# Patient Record
Sex: Female | Born: 1940 | Race: White | Hispanic: No | Marital: Single | Smoking: Former smoker
Health system: Southern US, Community
[De-identification: ages and names within clinical notes are randomized; demographics above are authoritative.]

## PROBLEM LIST (undated history)

## (undated) DIAGNOSIS — J449 Chronic obstructive pulmonary disease, unspecified: Secondary | ICD-10-CM

## (undated) DIAGNOSIS — N200 Calculus of kidney: Secondary | ICD-10-CM

## (undated) DIAGNOSIS — I10 Essential (primary) hypertension: Secondary | ICD-10-CM

## (undated) DIAGNOSIS — J189 Pneumonia, unspecified organism: Secondary | ICD-10-CM

## (undated) DIAGNOSIS — E785 Hyperlipidemia, unspecified: Secondary | ICD-10-CM

---

## 2011-11-01 ENCOUNTER — Inpatient Hospital Stay: Payer: Self-pay | Admitting: Internal Medicine

## 2011-11-01 LAB — CBC
MCHC: 34.8 g/dL (ref 32.0–36.0)
Platelet: 372 10*3/uL (ref 150–440)
RDW: 14.1 % (ref 11.5–14.5)

## 2011-11-01 LAB — BASIC METABOLIC PANEL
Anion Gap: 12 (ref 7–16)
BUN: 20 mg/dL — ABNORMAL HIGH (ref 7–18)
Chloride: 91 mmol/L — ABNORMAL LOW (ref 98–107)
EGFR (African American): 36 — ABNORMAL LOW
EGFR (Non-African Amer.): 31 — ABNORMAL LOW
Glucose: 111 mg/dL — ABNORMAL HIGH (ref 65–99)
Osmolality: 255 (ref 275–301)
Potassium: 3.8 mmol/L (ref 3.5–5.1)
Sodium: 125 mmol/L — ABNORMAL LOW (ref 136–145)

## 2011-11-01 LAB — TROPONIN I: Troponin-I: <0.02 ng/mL

## 2011-11-02 LAB — CBC WITH DIFFERENTIAL/PLATELET
Basophil %: 0 %
HCT: 32.4 % — ABNORMAL LOW (ref 35.0–47.0)
HGB: 11 g/dL — ABNORMAL LOW (ref 12.0–16.0)
Lymphocyte #: 0.6 10*3/uL — ABNORMAL LOW (ref 1.0–3.6)
Lymphocyte %: 4.8 %
MCHC: 34 g/dL (ref 32.0–36.0)
Monocyte %: 4.7 %
Neutrophil #: 12.1 10*3/uL — ABNORMAL HIGH (ref 1.4–6.5)
Neutrophil %: 90.5 %
RDW: 14.2 % (ref 11.5–14.5)
WBC: 13.3 10*3/uL — ABNORMAL HIGH (ref 3.6–11.0)

## 2011-11-02 LAB — BASIC METABOLIC PANEL
Anion Gap: 9 (ref 7–16)
Calcium, Total: 8 mg/dL — ABNORMAL LOW (ref 8.5–10.1)
Co2: 23 mmol/L (ref 21–32)
Creatinine: 1.41 mg/dL — ABNORMAL HIGH (ref 0.60–1.30)
EGFR (African American): 44 — ABNORMAL LOW
Glucose: 123 mg/dL — ABNORMAL HIGH (ref 65–99)

## 2011-11-02 LAB — OSMOLALITY: Osmolality: 270 mOsm/kg — ABNORMAL LOW (ref 280–301)

## 2011-11-03 LAB — CBC WITH DIFFERENTIAL/PLATELET
Basophil %: 0.1 %
Eosinophil %: 0 %
HGB: 10.8 g/dL — ABNORMAL LOW (ref 12.0–16.0)
Lymphocyte %: 3.6 %
Monocyte %: 3.6 %
Neutrophil %: 92.7 %
RBC: 3.87 10*6/uL (ref 3.80–5.20)
WBC: 19.8 10*3/uL — ABNORMAL HIGH (ref 3.6–11.0)

## 2011-11-03 LAB — BASIC METABOLIC PANEL
Chloride: 96 mmol/L — ABNORMAL LOW (ref 98–107)
Co2: 22 mmol/L (ref 21–32)
Creatinine: 1.59 mg/dL — ABNORMAL HIGH (ref 0.60–1.30)
EGFR (African American): 38 — ABNORMAL LOW
Glucose: 108 mg/dL — ABNORMAL HIGH (ref 65–99)
Potassium: 3.8 mmol/L (ref 3.5–5.1)

## 2011-11-04 LAB — BASIC METABOLIC PANEL
Anion Gap: 10 (ref 7–16)
Calcium, Total: 7.6 mg/dL — ABNORMAL LOW (ref 8.5–10.1)
EGFR (Non-African Amer.): 33 — ABNORMAL LOW
Glucose: 116 mg/dL — ABNORMAL HIGH (ref 65–99)
Osmolality: 270 (ref 275–301)
Potassium: 3.8 mmol/L (ref 3.5–5.1)
Sodium: 131 mmol/L — ABNORMAL LOW (ref 136–145)

## 2011-11-05 LAB — BASIC METABOLIC PANEL
Anion Gap: 9 (ref 7–16)
BUN: 37 mg/dL — ABNORMAL HIGH (ref 7–18)
Calcium, Total: 8.2 mg/dL — ABNORMAL LOW (ref 8.5–10.1)
Chloride: 96 mmol/L — ABNORMAL LOW (ref 98–107)
Creatinine: 1.89 mg/dL — ABNORMAL HIGH (ref 0.60–1.30)
EGFR (African American): 31 — ABNORMAL LOW
EGFR (Non-African Amer.): 27 — ABNORMAL LOW
Glucose: 106 mg/dL — ABNORMAL HIGH (ref 65–99)
Osmolality: 277 (ref 275–301)
Potassium: 4.3 mmol/L (ref 3.5–5.1)

## 2011-11-05 LAB — CBC WITH DIFFERENTIAL/PLATELET
Basophil #: 0 10*3/uL (ref 0.0–0.1)
Basophil %: 0.1 %
Eosinophil #: 0 10*3/uL (ref 0.0–0.7)
Eosinophil %: 0 %
HCT: 34.5 % — ABNORMAL LOW (ref 35.0–47.0)
HGB: 11.3 g/dL — ABNORMAL LOW (ref 12.0–16.0)
Lymphocyte %: 4.8 %
MCHC: 32.9 g/dL (ref 32.0–36.0)
Neutrophil #: 15.8 10*3/uL — ABNORMAL HIGH (ref 1.4–6.5)
Neutrophil %: 89.1 %
RBC: 4.18 10*6/uL (ref 3.80–5.20)
RDW: 14.5 % (ref 11.5–14.5)

## 2011-11-06 LAB — CULTURE, BLOOD (SINGLE)

## 2012-11-12 DIAGNOSIS — F32A Depression, unspecified: Secondary | ICD-10-CM | POA: Insufficient documentation

## 2012-11-12 DIAGNOSIS — E785 Hyperlipidemia, unspecified: Secondary | ICD-10-CM | POA: Insufficient documentation

## 2012-11-12 DIAGNOSIS — M810 Age-related osteoporosis without current pathological fracture: Secondary | ICD-10-CM | POA: Insufficient documentation

## 2012-11-12 DIAGNOSIS — F329 Major depressive disorder, single episode, unspecified: Secondary | ICD-10-CM | POA: Insufficient documentation

## 2013-02-21 ENCOUNTER — Emergency Department: Payer: Self-pay | Admitting: Internal Medicine

## 2013-02-21 LAB — COMPREHENSIVE METABOLIC PANEL
ALBUMIN: 3.7 g/dL (ref 3.4–5.0)
Alkaline Phosphatase: 106 U/L
Anion Gap: 5 — ABNORMAL LOW (ref 7–16)
BILIRUBIN TOTAL: 0.4 mg/dL (ref 0.2–1.0)
BUN: 9 mg/dL (ref 7–18)
CHLORIDE: 103 mmol/L (ref 98–107)
Calcium, Total: 8.7 mg/dL (ref 8.5–10.1)
Co2: 27 mmol/L (ref 21–32)
Creatinine: 1.21 mg/dL (ref 0.60–1.30)
EGFR (African American): 52 — ABNORMAL LOW
EGFR (Non-African Amer.): 45 — ABNORMAL LOW
GLUCOSE: 102 mg/dL — AB (ref 65–99)
Osmolality: 269 (ref 275–301)
POTASSIUM: 3.4 mmol/L — AB (ref 3.5–5.1)
SGOT(AST): 27 U/L (ref 15–37)
SGPT (ALT): 17 U/L (ref 12–78)
Sodium: 135 mmol/L — ABNORMAL LOW (ref 136–145)
Total Protein: 7.1 g/dL (ref 6.4–8.2)

## 2013-02-21 LAB — LIPASE, BLOOD: LIPASE: 107 U/L (ref 73–393)

## 2013-02-21 LAB — URINALYSIS, COMPLETE
BACTERIA: NONE SEEN
BILIRUBIN, UR: NEGATIVE
GLUCOSE, UR: NEGATIVE mg/dL (ref 0–75)
NITRITE: NEGATIVE
Ph: 7 (ref 4.5–8.0)
Protein: NEGATIVE
Specific Gravity: 1.013 (ref 1.003–1.030)
Squamous Epithelial: 2
WBC UR: <1 /HPF (ref 0–5)

## 2013-02-21 LAB — CBC
HCT: 35.7 % (ref 35.0–47.0)
HGB: 12.3 g/dL (ref 12.0–16.0)
MCH: 27.9 pg (ref 26.0–34.0)
MCHC: 34.4 g/dL (ref 32.0–36.0)
MCV: 81 fL (ref 80–100)
Platelet: 284 10*3/uL (ref 150–440)
RBC: 4.41 10*6/uL (ref 3.80–5.20)
RDW: 14.3 % (ref 11.5–14.5)
WBC: 10.9 10*3/uL (ref 3.6–11.0)

## 2013-02-21 LAB — TROPONIN I: Troponin-I: <0.02 ng/mL

## 2013-04-18 ENCOUNTER — Emergency Department: Payer: Self-pay | Admitting: Emergency Medicine

## 2013-08-10 DIAGNOSIS — J449 Chronic obstructive pulmonary disease, unspecified: Secondary | ICD-10-CM | POA: Insufficient documentation

## 2014-06-01 NOTE — Discharge Summary (Signed)
PATIENT NAME:  Penny Johnson, Penny Johnson DATE OF BIRTH:  05/01/1940  DATE OF ADMISSION:  11/01/2011 DATE OF DISCHARGE:  11/07/2011  DISCHARGE DIAGNOSES:  1. Acute on chronic respiratory failure, chronic obstructive pulmonary disease exacerbation and sepsis.  2. Hyponatremia, syndrome of inappropriate antidiuretic hormone.  3. Deconditioning. 4. Depression.  5. Hypertension.  6. Tachycardia.  7. Vitamin D deficiency.  8. Hyperlipidemia.  9. Acute on chronic renal failure.   DISCHARGE MEDICATIONS:  1. Alendronate 70 mg daily. 2. Vitamin D 50,000 units once monthly. 3. Meclizine 25 mg p.o. t.i.d.  4. Celexa 20 mg 2 tablets daily.  5. Simvastatin 40 mg p.o. daily.  6. Bystolic 5 mg p.o. daily.  7. Spiriva 18 micrograms inhalation daily.  8. Advair Diskus 250/50, one puff b.i.d.  9. Ventolin daily.  10. Mucinex 600 mg p.o. b.i.d.  11. Lasix 40 mg p.o. every other day.  12. Combivent 1 puff 4 times daily.  13. Augmentin 1 tablet p.o. b.i.d. for  10 days.  14. Prednisone dose tapering 60 mg daily for three days, 50 mg daily for three days, 40 mg daily for three days, 30 mg daily for three days, 20 mg daily for three days.   DIET: Low sodium diet.   CONSULTATIONS: Pulmonary consult with Dr. Belia HemanKasa.   HOSPITAL COURSE:  1. The patient is a 74 year old female with history of chronic obstructive pulmonary disease who came in because of shortness of breath, cough and weakness. The patient was given Levaquin and prednisone as an outpatient and treated for pneumonia because of significant trouble breathing. She came in because of her pneumonia and chronic obstructive pulmonary disease exacerbation. Continued on IV Rocephin and Zithromax along with nebulizers and steroids. The patient was observed in the Intensive Care Unit. She did not want to be intubated on CPAP and the patient continued to improve in terms of her respiration. The patient's oxygen saturation 98% on 4 liters. Her blood  pressure was 142/55, white count was 13.6 on admission. BUN 20, creatinine 1.68. Chest x-ray showed right-sided infiltrate. The patient  improved with treatment. The patient was ambulating to the bathroom with two liters of oxygen and felt much better, so we discharged her with prednisone and Augmentin. The blood cultures have been negative.and she had echo done which showed normal ejection fraction and normal systolic function, ejection fraction of 52%. The patient was seen by Dr. Belia HemanKasa as well and we made an appointment with Dr. Meredeth IdeFleming as an outpatient because of her chronic obstructive pulmonary disease.  2. Chronic renal insufficiency, hyponatremia. Creatinine is around 1.5 and sodium was 127 and 128, so we had to stop the fluids and give her Lasix. Lasix helped her with her sodium coming up to 134. She has some kidney problem. She told me that she had a history of stones before and ultrasound showed small kidneys. We made an appointment with Dr. Heide SparkMunson Lateef to evaluate for chronic kidney disease. She was given Lasix but advised her to take only on alternate days and follow up with Dr. Mady HaagensenMunsoor Lateef or Dr. Thedore MinsSingh.  3. Hypertension. Blood pressure was on the low side, so will stopped her Micardis that she was taking and she is only on Lasix.  4. History of depression. Continued on Celexa.  5. She has vitamin D deficiency. Advised to continue vitamin D.  6. The patient's oxygen saturation dropped to 78 with exertion, on 2 liters went up to 92, so advised to use oxygen all the time.  TIME SPENT ON DISCHARGE PREPARATION: More than 30 minutes. ____________________________ Katha Hamming, MD sk:ap D: 11/08/2011 14:02:50 ET T: 11/09/2011 11:54:11 ET JOB#: [id number] cc: Katha Hamming, MD, <Dictator> [name], MD Katha Hamming MD ELECTRONICALLY SIGNED 11/18/2011 14:52

## 2014-06-01 NOTE — H&P (Signed)
PATIENT NAME:  Penny Johnson, Penny Johnson MR#:  [id number] DATE OF BIRTH:  [birth year]  DATE OF ADMISSION:  11/01/2011  PRIMARY CARE PHYSICIAN: Dr. Mikey Bussing at [facility] in [place]   CHIEF COMPLAINT: Shortness of breath and cough.   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 Patient was in her usual state of health until about two weeks ago when she developed generalized weakness. She presented to her primary care provider and at that time chest x-ray was done which showed a right-sided pneumonia. She was started on prednisone 40 mg daily as well as Levaquin 500 mg for seven days. She was reassessed by her primary care physician three days ago and was asked to continue her antibiotic regimen for an additional week and she was to start a prednisone taper at that time. However, on the day of presentation she got progressive severe dyspnea throughout the day with associated weakness, fatigue, decreased p.o. intake so she decided to present to the Emergency Department. She notes that she has had a productive cough productive of clear sputum of increased frequency over the last day or so. She denies fevers.   Regarding her chronic obstructive pulmonary disease history, she notes that she does not follow with a pulmonologist. She has been managed by her primary care physician and has been stable. Daughters are in the room and contribute to the history stating that she has pursed lip breathing chronically and breathes fairly rapidly at baseline. Stating that she cannot walk to the bathroom due to shortness of breath. She have home oxygen, however, she says she does not use it daily and does not use it continuously, however, since she was diagnosed with pneumonia a week ago she has required continuous oxygen use and has increased her  albuterol inhaler use to five times a day where she usually uses it four times daily.    Upon evaluation in the Emergency Department she was noted to be more hypoxic, had increased oxygen needs, usually uses 2 liters of oxygen at home, required 4 liters in the Emergency Department, had leukocytosis, and right-sided infiltrate, as well as signs of acute renal failure so we have been asked to admit the patient.     PAST MEDICAL HISTORY:  1. Chronic obstructive pulmonary disease, emphysema, has home oxygen at home but does not use it daily.  2. Recent diagnosis of pneumonia one week ago undergoing treatment in the outpatient setting currently.  3. Hyperlipidemia.  4. Hypertension.  5. Depression.  6. Osteoporosis.  7. History of nephrolithiasis.  8. Vitamin D deficiency.  9. History of coal dust exposure due to working at a [employer]   PAST SURGICAL HISTORY: Bilateral cataract surgery two months ago.   ALLERGIES: No known drug allergies but has seasonal allergies.   MEDICATIONS:  1. Advair Diskus 250/50 mcg 1 puff inhaled twice a day.  2. Albuterol inhaled 4 times a day.  3. Alendronate 40 mg once a month.  4. Bystolic 5 mg 1 tablet once a day as needed.  5. Citalopram 20 mg daily.  6. Meclizine 25 mg 3 times a day as needed.  7. Micardis 40 mg daily.  8. Simvastatin 40 mg daily at bedtime.  9. Spiriva 18 mcg inhaled daily.  10. Vitamin D2 50,000 international units (1.25 mg) monthly.  11. Zyrtec as needed.   FAMILY HISTORY: Father had  Alzheimer's. Mother had skin cancer and hypertension. Brother had myocardial infarction. No history of cerebrovascular.   SOCIAL HISTORY: Lives with her daughter. Had remote tobacco use, 1 pack per day from age 74 to [age], quit 10 years ago. Denies alcohol or illicit drug use. 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 She has two daughters and three grandchildren.   REVIEW OF SYSTEMS: CONSTITUTIONAL:  Denies fever, weight loss. Admits to fatigue and generalized weakness. She admits to decreased p.o. intake. NEURO: Denies numbness or cerebrovascular accident. Admits to mild dizziness that is intermittent in nature, nothing recently. PSYCHIATRIC: Admits to recent insomnia since she was started on prednisone. Admits to depression. EYES: Denies blurred vision or double vision. Denies eye pain. Had cataract surgery. ENT: Has chronic tinnitus and has left ear hearing difficulty. Has seasonal allergies and postnasal drip. RESPIRATION: Admits to cough, wheezing, dyspnea. Denies painful respirations. CARDIOVASCULAR: Admits to heaviness of her chest this evening, intermittent palpitations overall and dyspnea on exertion. Denies orthopnea or edema. Admits to intermittent palpitations. GASTROINTESTINAL: Denies nausea, vomiting, diarrhea, abdominal pain. GENITOURINARY: Denies dysuria, frequency. HEME: Admits to easy bruising. INTEGUMENT: No new skin rashes. MUSCULOSKELETAL: Has chronic bilateral shoulder pain but no joint swelling.    PHYSICAL EXAMINATION:  VITAL SIGNS: Temperature 97.7, heart rate ranged between 76 to 100, respirations ranged between 24 to 39, blood pressure 114/55, sating 98% on 4 liters nasal cannula.   GENERAL: Elderly female, well appearing, however, in moderate respiratory distress. She is able to speak in bursts of four to five word sentences. She does have pursed lip breathing.   EYES: Pupils equal, round, reactive to light and accommodation. Anicteric sclerae. Normal lids.   ENT: Normal external ears and nares. Posterior oropharynx is clear without erythema or exudate.   RESPIRATORY: Diffuse wheezing with prolonged expiratory phase. She does have increased effort and moderate respiratory distress. During my exam she was breathing at about 40 times a minute.   CARDIOVASCULAR: Tachycardic. No murmurs appreciated. There is no lower extremity edema.   ABDOMEN: Soft, nontender, nondistended  without hepatosplenomegaly.   MUSCULOSKELETAL: She has full strength bilateral upper and lower extremities. There is no digital clubbing. There is normal tone. There is no cyanosis.   SKIN: Warm and dry. There are no lesions. Patient does not appear cyanotic.    LYMPHATIC: No cervical or inguinal lymphadenopathy appreciated.   PSYCHIATRIC: Patient is awake, alert, oriented to time, person, place and situation. Judgment is intact   LABORATORY, DIAGNOSTIC AND RADIOLOGICAL DATA:  Troponin is less than 0.02. CK 186, CK-MB 5.9.   WBC count is elevated at 13.6, hemoglobin 10.5, hematocrit 30.1, platelet count 372, MCV 81.   Glucose 111, BUN 20, creatinine 1.68, sodium 125, potassium 3.8, chloride 91, bicarbonate 22, calcium 8.6, anion gap 12, osmolality 255, GFR 36.   Chest x-ray on my read shows hyperinflation with right-sided infiltrate. There is no effusion. Official report is pending.   EKG shows normal sinus rhythm at 26 beats per minute. There is low voltage diffusely. No acute ST-T wave abnormalities.    ASSESSMENT AND PLAN: 74 year old female presenting with tachypnea, leukocytosis, right-sided infiltrate concerning for pneumonia with sepsis as well as acute respiratory failure as a result of superimposed chronic obstructive pulmonary disease exacerbation, acute renal failure, hyponatremia.  1. Pneumonia with severe sepsis as evidenced by tachypnea, leukocytosis, renal and acute on chronic respiratory failure. First will expand IV antibiotics, change to vancomycin, cefepime  and Zithromax, even though patient denied history of nosocomial exposure. Will check blood as well as respiratory cultures and adjust antibiotics within 48 hours or so within approximately 48 hours. Continue Solu-Medrol as well as nebulizer treatments. 2. Acute on chronic respiratory failure due to pneumonia as well as acute chronic obstructive pulmonary disease exacerbation which is most likely as a result of her  pneumonia. Family's description of patient's baseline sounds like she might have severe chronic obstructive pulmonary disease, however, we do not have pulmonary function tests at this facility to indicate that currently. Will continue Solu-Medrol, nebulizers and antibiotic therapy as well as supplemental oxygen as indicated. Will place patient on BiPAP. Check an ABG and lactate now to rule out superimposed hypercarbia given her respiratory rate. I am concerned that this patient will tire out due to her tachypnea and this was discussed with the patient. She is amenable to short-term intubation for a reversible process such as this one, however, she did not want to be resuscitated. Will check V/Q scan to rule out chronic pulmonary embolism. 3. Acute renal failure. This is most likely combination of medication use superimposed on decreased p.o. intake with her acute illness. We will hydrate her and follow IV fluids. Will also request records from her primary care provider. Will monitor BMPs.  4. Hyponatremia, hypokalemia. This is most likely as a result of decreased p.o. intake. However, given her pneumonia and respiratory status there is possibility of underlying SIADH as well. We do not have a baseline. At this time will check urine studies and hydrate her gently.  5. Hypertension, currently stable. Will continue her home medications. Will discontinue her ARB. 6. Hyperlipidemia, stable. Continue her home medications.  7. Osteoporosis. Continue vitamin D. Hold Fosamax.  8. Prophylaxis. Heparin.  9. Disposition: Patient is being admitted to the Critical Care Unit for severe pneumonia, severe sepsis, acute on chronic respiratory failure, acute renal failure.  10. Patient's condition is serious and prognosis is guarded. This was explained to the patient and her family. 11. CODE STATUS: Patient is a DO NOT RESUSCITATE and both her daughters are powers of attorney for her, however, the primary contact and decision  maker is Ferne Reus, phone number [phone]. Family is aware of patient's wishes.  12. We shall to obtain records from Dr. Neita Garnet office at [facility].   CRITICAL CARE TIME SPENT: 70 minutes.  ____________________________ Aurther Loft, DO aeo:cms D: 11/01/2011 06:29:10 ET T: 11/01/2011 07:04:18 ET JOB#: [id number]  cc: Aurther Loft, DO, <Dictator> Dr. Mikey Bussing at [facility] in [place] Hamsini Verrilli E Jessy Cybulski DO ELECTRONICALLY SIGNED 11/18/2011 0:47

## 2018-06-05 ENCOUNTER — Inpatient Hospital Stay
Admission: EM | Admit: 2018-06-05 | Discharge: 2018-06-08 | DRG: 418 | Disposition: A | Discharge status: Home / Self Care | Payer: Medicare HMO | Attending: Internal Medicine | Admitting: Internal Medicine

## 2018-06-05 ENCOUNTER — Emergency Department: Payer: Medicare HMO

## 2018-06-05 ENCOUNTER — Other Ambulatory Visit: Payer: Self-pay

## 2018-06-05 ENCOUNTER — Encounter
Admission: EM | Disposition: A | Discharge status: Home / Self Care | Payer: Self-pay | Source: Home / Self Care | Attending: Internal Medicine

## 2018-06-05 ENCOUNTER — Inpatient Hospital Stay: Payer: Medicare HMO | Admitting: Anesthesiology

## 2018-06-05 ENCOUNTER — Encounter: Payer: Self-pay | Admitting: Emergency Medicine

## 2018-06-05 ENCOUNTER — Other Ambulatory Visit (HOSPITAL_COMMUNITY): Payer: Self-pay

## 2018-06-05 ENCOUNTER — Inpatient Hospital Stay: Payer: Medicare HMO

## 2018-06-05 DIAGNOSIS — K8050 Calculus of bile duct without cholangitis or cholecystitis without obstruction: Secondary | ICD-10-CM | POA: Diagnosis present

## 2018-06-05 DIAGNOSIS — M810 Age-related osteoporosis without current pathological fracture: Secondary | ICD-10-CM | POA: Diagnosis present

## 2018-06-05 DIAGNOSIS — K8000 Calculus of gallbladder with acute cholecystitis without obstruction: Secondary | ICD-10-CM | POA: Diagnosis present

## 2018-06-05 DIAGNOSIS — Z79899 Other long term (current) drug therapy: Secondary | ICD-10-CM

## 2018-06-05 DIAGNOSIS — R7989 Other specified abnormal findings of blood chemistry: Secondary | ICD-10-CM | POA: Diagnosis present

## 2018-06-05 DIAGNOSIS — Z808 Family history of malignant neoplasm of other organs or systems: Secondary | ICD-10-CM

## 2018-06-05 DIAGNOSIS — R932 Abnormal findings on diagnostic imaging of liver and biliary tract: Secondary | ICD-10-CM

## 2018-06-05 DIAGNOSIS — I10 Essential (primary) hypertension: Secondary | ICD-10-CM | POA: Diagnosis present

## 2018-06-05 DIAGNOSIS — Z7951 Long term (current) use of inhaled steroids: Secondary | ICD-10-CM | POA: Diagnosis not present

## 2018-06-05 DIAGNOSIS — E876 Hypokalemia: Secondary | ICD-10-CM | POA: Diagnosis present

## 2018-06-05 DIAGNOSIS — Z820 Family history of epilepsy and other diseases of the nervous system: Secondary | ICD-10-CM

## 2018-06-05 DIAGNOSIS — K660 Peritoneal adhesions (postprocedural) (postinfection): Secondary | ICD-10-CM | POA: Diagnosis present

## 2018-06-05 DIAGNOSIS — N179 Acute kidney failure, unspecified: Secondary | ICD-10-CM | POA: Diagnosis present

## 2018-06-05 DIAGNOSIS — K5730 Diverticulosis of large intestine without perforation or abscess without bleeding: Secondary | ICD-10-CM | POA: Diagnosis present

## 2018-06-05 DIAGNOSIS — Z8249 Family history of ischemic heart disease and other diseases of the circulatory system: Secondary | ICD-10-CM

## 2018-06-05 DIAGNOSIS — K8071 Calculus of gallbladder and bile duct without cholecystitis with obstruction: Secondary | ICD-10-CM | POA: Diagnosis present

## 2018-06-05 DIAGNOSIS — Z87442 Personal history of urinary calculi: Secondary | ICD-10-CM | POA: Diagnosis not present

## 2018-06-05 DIAGNOSIS — Z9981 Dependence on supplemental oxygen: Secondary | ICD-10-CM

## 2018-06-05 DIAGNOSIS — Z419 Encounter for procedure for purposes other than remedying health state, unspecified: Secondary | ICD-10-CM

## 2018-06-05 DIAGNOSIS — E785 Hyperlipidemia, unspecified: Secondary | ICD-10-CM | POA: Diagnosis present

## 2018-06-05 DIAGNOSIS — J441 Chronic obstructive pulmonary disease with (acute) exacerbation: Secondary | ICD-10-CM | POA: Diagnosis present

## 2018-06-05 HISTORY — PX: ERCP: SHX5425

## 2018-06-05 HISTORY — DX: Essential (primary) hypertension: I10

## 2018-06-05 HISTORY — DX: Calculus of kidney: N20.0

## 2018-06-05 HISTORY — DX: Pneumonia, unspecified organism: J18.9

## 2018-06-05 HISTORY — DX: Hyperlipidemia, unspecified: E78.5

## 2018-06-05 HISTORY — DX: Chronic obstructive pulmonary disease, unspecified: J44.9

## 2018-06-05 LAB — COMPREHENSIVE METABOLIC PANEL
ALT: 131 U/L — ABNORMAL HIGH (ref 0–44)
AST: 431 U/L — ABNORMAL HIGH (ref 15–41)
Albumin: 4.2 g/dL (ref 3.5–5.0)
Alkaline Phosphatase: 170 U/L — ABNORMAL HIGH (ref 38–126)
Anion gap: 11 (ref 5–15)
BUN: 12 mg/dL (ref 8–23)
CO2: 27 mmol/L (ref 22–32)
Calcium: 9 mg/dL (ref 8.9–10.3)
Chloride: 96 mmol/L — ABNORMAL LOW (ref 98–111)
Creatinine, Ser: 1.12 mg/dL — ABNORMAL HIGH (ref 0.44–1.00)
GFR calc Af Amer: 55 mL/min — ABNORMAL LOW (ref >60)
GFR calc non Af Amer: 48 mL/min — ABNORMAL LOW (ref >60)
Glucose, Bld: 165 mg/dL — ABNORMAL HIGH (ref 70–99)
Potassium: 3.2 mmol/L — ABNORMAL LOW (ref 3.5–5.1)
Sodium: 134 mmol/L — ABNORMAL LOW (ref 135–145)
Total Bilirubin: 1.9 mg/dL — ABNORMAL HIGH (ref 0.3–1.2)
Total Protein: 7.1 g/dL (ref 6.5–8.1)

## 2018-06-05 LAB — URINALYSIS, COMPLETE (UACMP) WITH MICROSCOPIC
Bacteria, UA: NONE SEEN
Bilirubin Urine: NEGATIVE
Glucose, UA: NEGATIVE mg/dL
Ketones, ur: NEGATIVE mg/dL
Nitrite: NEGATIVE
Protein, ur: NEGATIVE mg/dL
Specific Gravity, Urine: >1.046 — ABNORMAL HIGH (ref 1.005–1.030)
pH: 6 (ref 5.0–8.0)

## 2018-06-05 LAB — CBC WITH DIFFERENTIAL/PLATELET
Abs Immature Granulocytes: 0.05 10*3/uL (ref 0.00–0.07)
Basophils Absolute: 0 10*3/uL (ref 0.0–0.1)
Basophils Relative: 0 %
Eosinophils Absolute: 0 10*3/uL (ref 0.0–0.5)
Eosinophils Relative: 0 %
HCT: 36.4 % (ref 36.0–46.0)
Hemoglobin: 12 g/dL (ref 12.0–15.0)
Immature Granulocytes: 0 %
Lymphocytes Relative: 5 %
Lymphs Abs: 0.6 10*3/uL — ABNORMAL LOW (ref 0.7–4.0)
MCH: 29.5 pg (ref 26.0–34.0)
MCHC: 33 g/dL (ref 30.0–36.0)
MCV: 89.4 fL (ref 80.0–100.0)
Monocytes Absolute: 0.7 10*3/uL (ref 0.1–1.0)
Monocytes Relative: 6 %
Neutro Abs: 10.7 10*3/uL — ABNORMAL HIGH (ref 1.7–7.7)
Neutrophils Relative %: 89 %
Platelets: 243 10*3/uL (ref 150–400)
RBC: 4.07 MIL/uL (ref 3.87–5.11)
RDW: 13.2 % (ref 11.5–15.5)
WBC: 12.2 10*3/uL — ABNORMAL HIGH (ref 4.0–10.5)
nRBC: 0 % (ref 0.0–0.2)

## 2018-06-05 LAB — LIPASE, BLOOD: Lipase: 36 U/L (ref 11–51)

## 2018-06-05 SURGERY — ERCP, WITH INTERVENTION IF INDICATED
Anesthesia: General

## 2018-06-05 MED ORDER — IPRATROPIUM-ALBUTEROL 0.5-2.5 (3) MG/3ML IN SOLN: 3.0000 mL | RESPIRATORY_TRACT | Status: DC | PRN

## 2018-06-05 MED ORDER — FENTANYL CITRATE (PF) 100 MCG/2ML IJ SOLN
INTRAMUSCULAR | Status: DC | PRN
  Administered 2018-06-05: 50 ug via INTRAVENOUS

## 2018-06-05 MED ORDER — IPRATROPIUM-ALBUTEROL 0.5-2.5 (3) MG/3ML IN SOLN
3.0000 mL | Freq: Four times a day (QID) | RESPIRATORY_TRACT | Status: DC
  Administered 2018-06-05 – 2018-06-08 (×10): 3 mL via RESPIRATORY_TRACT
  Filled 2018-06-05 (×10): qty 3

## 2018-06-05 MED ORDER — IPRATROPIUM-ALBUTEROL 0.5-2.5 (3) MG/3ML IN SOLN
3.0000 mL | Freq: Once | RESPIRATORY_TRACT | Status: AC
  Administered 2018-06-05: 3 mL via RESPIRATORY_TRACT

## 2018-06-05 MED ORDER — ALBUTEROL SULFATE HFA 108 (90 BASE) MCG/ACT IN AERS
INHALATION_SPRAY | RESPIRATORY_TRACT | Status: DC | PRN
  Administered 2018-06-05: 6 via RESPIRATORY_TRACT

## 2018-06-05 MED ORDER — DEXAMETHASONE SODIUM PHOSPHATE 4 MG/ML IJ SOLN
INTRAMUSCULAR | Status: AC
  Filled 2018-06-05: qty 1

## 2018-06-05 MED ORDER — FENTANYL CITRATE (PF) 100 MCG/2ML IJ SOLN
INTRAMUSCULAR | Status: AC
  Filled 2018-06-05: qty 2

## 2018-06-05 MED ORDER — SERTRALINE HCL 50 MG PO TABS
25.0000 mg | ORAL_TABLET | Freq: Every day | ORAL | Status: DC
  Administered 2018-06-05 – 2018-06-08 (×3): 25 mg via ORAL
  Filled 2018-06-05 (×3): qty 1

## 2018-06-05 MED ORDER — INDOMETHACIN 50 MG RE SUPP
100.0000 mg | Freq: Once | RECTAL | Status: AC
  Administered 2018-06-05: 100 mg via RECTAL
  Filled 2018-06-05: qty 2

## 2018-06-05 MED ORDER — MOMETASONE FURO-FORMOTEROL FUM 200-5 MCG/ACT IN AERO
2.0000 | INHALATION_SPRAY | Freq: Two times a day (BID) | RESPIRATORY_TRACT | Status: DC
  Administered 2018-06-06 – 2018-06-08 (×6): 2 via RESPIRATORY_TRACT
  Filled 2018-06-05: qty 8.8

## 2018-06-05 MED ORDER — PROPOFOL 10 MG/ML IV BOLUS
INTRAVENOUS | Status: AC
  Filled 2018-06-05: qty 20

## 2018-06-05 MED ORDER — ONDANSETRON HCL 4 MG/2ML IJ SOLN
4.0000 mg | Freq: Once | INTRAMUSCULAR | Status: AC
  Administered 2018-06-05: 05:00:00 4 mg via INTRAVENOUS

## 2018-06-05 MED ORDER — POTASSIUM CHLORIDE 10 MEQ/100ML IV SOLN
10.0000 meq | INTRAVENOUS | Status: AC
  Administered 2018-06-05 (×2): 10 meq via INTRAVENOUS
  Filled 2018-06-05 (×3): qty 100

## 2018-06-05 MED ORDER — PROPOFOL 10 MG/ML IV BOLUS
INTRAVENOUS | Status: DC | PRN
  Administered 2018-06-05: 80 mg via INTRAVENOUS

## 2018-06-05 MED ORDER — ALBUTEROL SULFATE HFA 108 (90 BASE) MCG/ACT IN AERS
INHALATION_SPRAY | RESPIRATORY_TRACT | Status: AC
  Filled 2018-06-05: qty 6.7

## 2018-06-05 MED ORDER — IPRATROPIUM-ALBUTEROL 0.5-2.5 (3) MG/3ML IN SOLN
3.0000 mL | Freq: Four times a day (QID) | RESPIRATORY_TRACT | Status: DC
  Administered 2018-06-05: 14:00:00 3 mL via RESPIRATORY_TRACT
  Filled 2018-06-05: qty 3

## 2018-06-05 MED ORDER — LACTATED RINGERS IV SOLN
INTRAVENOUS | Status: DC
  Administered 2018-06-05: 16:00:00 via INTRAVENOUS

## 2018-06-05 MED ORDER — PIPERACILLIN-TAZOBACTAM 3.375 G IVPB
3.3750 g | Freq: Three times a day (TID) | INTRAVENOUS | Status: DC
  Administered 2018-06-05 – 2018-06-06 (×4): 3.375 g via INTRAVENOUS
  Filled 2018-06-05 (×4): qty 50

## 2018-06-05 MED ORDER — MORPHINE SULFATE (PF) 2 MG/ML IV SOLN
INTRAVENOUS | Status: AC
  Administered 2018-06-05: 2 mg via INTRAVENOUS
  Filled 2018-06-05: qty 1

## 2018-06-05 MED ORDER — DEXAMETHASONE SODIUM PHOSPHATE 10 MG/ML IJ SOLN
INTRAMUSCULAR | Status: DC | PRN
  Administered 2018-06-05: 4 mg via INTRAVENOUS

## 2018-06-05 MED ORDER — INDOMETHACIN 50 MG RE SUPP
RECTAL | Status: AC
  Administered 2018-06-05: 100 mg via RECTAL
  Filled 2018-06-05: qty 1

## 2018-06-05 MED ORDER — ONDANSETRON HCL 4 MG/2ML IJ SOLN
INTRAMUSCULAR | Status: DC | PRN
  Administered 2018-06-05: 4 mg via INTRAVENOUS

## 2018-06-05 MED ORDER — METHYLPREDNISOLONE SODIUM SUCC 40 MG IJ SOLR
40.0000 mg | Freq: Two times a day (BID) | INTRAMUSCULAR | Status: DC
  Administered 2018-06-05 – 2018-06-08 (×6): 40 mg via INTRAVENOUS
  Filled 2018-06-05 (×6): qty 1

## 2018-06-05 MED ORDER — SUGAMMADEX SODIUM 200 MG/2ML IV SOLN
INTRAVENOUS | Status: AC
  Filled 2018-06-05: qty 2

## 2018-06-05 MED ORDER — SODIUM CHLORIDE 0.9 % IV SOLN
INTRAVENOUS | Status: DC
  Administered 2018-06-05: 10:00:00 via INTRAVENOUS

## 2018-06-05 MED ORDER — ONDANSETRON HCL 4 MG/2ML IJ SOLN
INTRAMUSCULAR | Status: AC
  Administered 2018-06-05: 4 mg via INTRAVENOUS
  Filled 2018-06-05: qty 2

## 2018-06-05 MED ORDER — ROCURONIUM BROMIDE 50 MG/5ML IV SOLN
INTRAVENOUS | Status: AC
  Filled 2018-06-05: qty 1

## 2018-06-05 MED ORDER — LIDOCAINE HCL (CARDIAC) PF 100 MG/5ML IV SOSY
PREFILLED_SYRINGE | INTRAVENOUS | Status: DC | PRN
  Administered 2018-06-05: 60 mg via INTRAVENOUS

## 2018-06-05 MED ORDER — IPRATROPIUM-ALBUTEROL 0.5-2.5 (3) MG/3ML IN SOLN
RESPIRATORY_TRACT | Status: AC
  Filled 2018-06-05: qty 6

## 2018-06-05 MED ORDER — SUCCINYLCHOLINE CHLORIDE 20 MG/ML IJ SOLN
INTRAMUSCULAR | Status: AC
  Filled 2018-06-05: qty 1

## 2018-06-05 MED ORDER — SUGAMMADEX SODIUM 200 MG/2ML IV SOLN
INTRAVENOUS | Status: DC | PRN
  Administered 2018-06-05: 140 mg via INTRAVENOUS

## 2018-06-05 MED ORDER — SODIUM CHLORIDE 0.9 % IV BOLUS
500.0000 mL | Freq: Once | INTRAVENOUS | Status: AC
  Administered 2018-06-05: 500 mL via INTRAVENOUS

## 2018-06-05 MED ORDER — AZELASTINE HCL 0.1 % NA SOLN
1.0000 | Freq: Two times a day (BID) | NASAL | Status: DC | PRN
  Filled 2018-06-05: qty 30

## 2018-06-05 MED ORDER — LORATADINE 10 MG PO TABS: 10.0000 mg | ORAL_TABLET | Freq: Every day | ORAL | Status: DC | PRN

## 2018-06-05 MED ORDER — MECLIZINE HCL 25 MG PO TABS
25.0000 mg | ORAL_TABLET | Freq: Three times a day (TID) | ORAL | Status: DC
  Administered 2018-06-05 – 2018-06-06 (×3): 25 mg via ORAL
  Filled 2018-06-05 (×8): qty 1

## 2018-06-05 MED ORDER — LIDOCAINE HCL (PF) 2 % IJ SOLN
INTRAMUSCULAR | Status: AC
  Filled 2018-06-05: qty 10

## 2018-06-05 MED ORDER — CITALOPRAM HYDROBROMIDE 20 MG PO TABS
20.0000 mg | ORAL_TABLET | Freq: Two times a day (BID) | ORAL | Status: DC
  Administered 2018-06-05 – 2018-06-08 (×6): 20 mg via ORAL
  Filled 2018-06-05 (×6): qty 1

## 2018-06-05 MED ORDER — MORPHINE SULFATE (PF) 2 MG/ML IV SOLN
2.0000 mg | Freq: Once | INTRAVENOUS | Status: AC
  Administered 2018-06-05: 05:00:00 2 mg via INTRAVENOUS

## 2018-06-05 MED ORDER — ROCURONIUM BROMIDE 100 MG/10ML IV SOLN
INTRAVENOUS | Status: DC | PRN
  Administered 2018-06-05: 5 mg via INTRAVENOUS

## 2018-06-05 MED ORDER — SODIUM CHLORIDE 0.9 % IV SOLN: INTRAVENOUS | Status: DC

## 2018-06-05 MED ORDER — ONDANSETRON HCL 4 MG/2ML IJ SOLN
INTRAMUSCULAR | Status: AC
  Filled 2018-06-05: qty 2

## 2018-06-05 MED ORDER — IOHEXOL 300 MG/ML  SOLN
100.0000 mL | Freq: Once | INTRAMUSCULAR | Status: AC | PRN
  Administered 2018-06-05: 100 mL via INTRAVENOUS

## 2018-06-05 MED ORDER — SUCCINYLCHOLINE CHLORIDE 20 MG/ML IJ SOLN
INTRAMUSCULAR | Status: DC | PRN
  Administered 2018-06-05: 100 mg via INTRAVENOUS

## 2018-06-05 NOTE — ED Triage Notes (Signed)
Patient coming from home via [facility] for abd pain that started right around midnight. Patient has had some nausea\. Denies fever. Patient has COPD and is chronically on 2L [place] at home.

## 2018-06-05 NOTE — Consult Note (Signed)
Subjective:   CC: choledocolithiasis  HPI:  Penny Johnson is a 78 y.o. female who is consulted by [facility] for evaluation of above cc.  Symptoms were first noted a few days ago. Pain is intermittent, epigastric  Associated with occasional nausa, exacerbated by nothing specific.    Currently denies any pain after getting IV meds.  Denies any SOB, but states she does have some exertional dyspnea at home after moderate activity.     Past Medical History:  has a past medical history of COPD (chronic obstructive pulmonary disease) (HCC), Hyperlipidemia, Hypertension, Nephrolithiasis, and Pneumonia.  Past Surgical History: c-sections  Family History: reviewed and not relevant to CC  Social History:  has no history on file for tobacco, alcohol, and drug.  Current Medications:  Medications Prior to Admission  Medication Sig Dispense Refill  . acetaminophen (TYLENOL) 325 MG tablet Take 650 mg by mouth every 4 (four) hours as needed for pain.    Marland Kitchen. albuterol (PROVENTIL) (2.5 MG/3ML) 0.083% nebulizer solution Inhale 3 mLs into the lungs 4 (four) times daily.    Marland Kitchen. albuterol (VENTOLIN HFA) 108 (90 Base) MCG/ACT inhaler Inhale 2 puffs into the lungs every 4 (four) hours as needed for wheezing.    Marland Kitchen. alendronate (FOSAMAX) 70 MG tablet Take 70 mg by mouth once a week.    Marland Kitchen. azelastine (ASTELIN) 0.1 % nasal spray Place 1-2 sprays into both nostrils 2 (two) times daily.    . citalopram (CELEXA) 40 MG tablet Take 20 mg by mouth 2 (two) times daily.     . Fluticasone-Salmeterol (ADVAIR) 250-50 MCG/DOSE AEPB Inhale 1 puff into the lungs 2 (two) times daily.    . furosemide (LASIX) 40 MG tablet Take 40 mg by mouth 3 (three) times a week.     Marland Kitchen. ipratropium (ATROVENT) 0.02 % nebulizer solution Inhale 2.5 mLs into the lungs 4 (four) times daily.    . meclizine (ANTIVERT) 25 MG tablet Take 25 mg by mouth 3 (three) times daily.    . metoprolol tartrate (LOPRESSOR) 25 MG tablet Take 12.5 mg by mouth daily at 2 PM.     . sertraline (ZOLOFT) 25 MG tablet Take 25 mg by mouth daily.    . simvastatin (ZOCOR) 40 MG tablet Take 20 mg by mouth daily.     Marland Kitchen. SPIRIVA HANDIHALER 18 MCG inhalation capsule Place 1 capsule into inhaler and inhale daily.    . fexofenadine (ALLEGRA) 60 MG tablet Take 60 mg by mouth daily.      Allergies:  Allergies as of 06/05/2018 - Review Complete 06/05/2018  Allergen Reaction Noted  . Theophylline Palpitations 07/27/2013    ROS:  General: Denies weight loss, weight gain, fatigue, fevers, chills, and night sweats. Eyes: Denies blurry vision, double vision, eye pain, itchy eyes, and tearing. Ears: Denies hearing loss, earache, and ringing in ears. Nose: Denies sinus pain, congestion, infections, runny nose, and nosebleeds. Mouth/throat: Denies hoarseness, sore throat, bleeding gums, and difficulty swallowing. Heart: Denies chest pain, palpitations, racing heart, irregular heartbeat, leg pain or swelling, and decreased activity tolerance. Respiratory: Denies breathing difficulty, shortness of breath, wheezing, cough, and sputum. GI: Denies change in appetite, heartburn, nausea, vomiting, constipation, diarrhea, and blood in stool. GU: Denies difficulty urinating, pain with urinating, urgency, frequency, blood in urine. Musculoskeletal: Denies joint stiffness, pain, swelling, muscle weakness. Skin: Denies rash, itching, mass, tumors, sores, and boils Neurologic: Denies headache, fainting, dizziness, seizures, numbness, and tingling. Psychiatric: Denies depression, anxiety, difficulty sleeping, and memory loss. Endocrine: Denies heat or  cold intolerance, and increased thirst or urination. Blood/lymph: Denies easy bruising, easy bruising, and swollen glands     Objective:     BP (!) 83/66 (BP Location: Right Arm)   Pulse 93   Temp 97.8 F (36.6 C)   Resp 18   Ht 5\' 3"  (1.6 m)   Wt 69.3 kg   SpO2 98%   BMI 27.06 kg/m    Constitutional :  alert, cooperative, appears  stated age and no distress  Lymphatics/Throat:  no asymmetry, masses, or scars  Respiratory:  diminished breath sounds bilaterally  Cardiovascular:  regular rate and rhythm  Gastrointestinal: soft, non-tender; bowel sounds normal; no masses,  no organomegaly.   Musculoskeletal: Steady gait and movement  Skin: Cool and moist  Psychiatric: Normal affect, non-agitated, not confused       LABS:  CMP Latest Ref Rng & Units 06/05/2018 02/21/2013 11/05/2011  Glucose 70 - 99 mg/dL 595(G) 387(F) 643(P)  BUN 8 - 23 mg/dL 12 9 29(J)  Creatinine 0.44 - 1.00 mg/dL 1.88(C) 1.66 0.63(K)  Sodium 135 - 145 mmol/L 134(L) 135(L) 134(L)  Potassium 3.5 - 5.1 mmol/L 3.2(L) 3.4(L) 4.3  Chloride 98 - 111 mmol/L 96(L) 103 96(L)  CO2 22 - 32 mmol/L 27 27 29   Calcium 8.9 - 10.3 mg/dL 9.0 8.7 1.6(W)  Total Protein 6.5 - 8.1 g/dL 7.1 7.1 -  Total Bilirubin 0.3 - 1.2 mg/dL 1.0(X) 0.4 -  Alkaline Phos 38 - 126 U/L 170(H) 106 -  AST 15 - 41 U/L 431(H) 27 -  ALT 0 - 44 U/L 131(H) 17 -   CBC Latest Ref Rng & Units 06/05/2018 02/21/2013 11/05/2011  WBC 4.0 - 10.5 K/uL 12.2(H) 10.9 17.8(H)  Hemoglobin 12.0 - 15.0 g/dL 32.3 55.7 11.3(L)  Hematocrit 36.0 - 46.0 % 36.4 35.7 34.5(L)  Platelets 150 - 400 K/uL 243 284 354     RADS: CLINICAL DATA:  78 year old female with abdominal pain since midnight. Nausea. On home oxygen.  EXAM: CT ABDOMEN AND PELVIS WITH CONTRAST  TECHNIQUE: Multidetector CT imaging of the abdomen and pelvis was performed using the standard protocol following bolus administration of intravenous contrast.  CONTRAST:  OMNIPAQUE IOHEXOL 300 MG/ML  SOLN  COMPARISON:  Chest radiographs 03/04/2018 and earlier.  FINDINGS: Lower chest: Centrilobular emphysema at the lung bases. No confluent lower lung opacity, pleural or pericardial effusion. Calcified granuloma in the left posterior costophrenic angle.  Hepatobiliary: Multiple gallstones measuring up to 20 millimeters diameter.  There are less calcified stones impacted within the common bile duct seen on coronal images 34 through 36. The distal impacted stone is estimated at 16 millimeters diameter. The duct is dilated from 11-20 millimeters diameter.  Superimposed generalized intrahepatic biliary ductal dilatation. No pericholecystic inflammatory stranding at this time. No discrete liver lesion.  Pancreas: Main pancreatic duct remains within normal limits. No pancreatic inflammation identified.  Spleen: Negative (small calcified granuloma).  Adrenals/Urinary Tract: Negative adrenal glands. Bilateral renal enhancement and contrast excretion is symmetric and normal. Occasional renal cortical scarring. Decompressed proximal ureters. Unremarkable urinary bladder. Pelvic phleboliths.  Stomach/Bowel: No dilated large or small bowel. Diverticulosis of the sigmoid colon and descending colon with no active inflammation. Redundant large bowel. The transverse colon is decompressed. Occasional transverse colon diverticula. Negative terminal ileum. Normal appendix on series 2, image 61. Mildly fluid distended stomach. Small gastric hiatal hernia.  No free air, free fluid.  Vascular/Lymphatic: Extensive Aortoiliac calcified atherosclerosis. Major arterial structures are patent. Portal venous system is patent.  Reproductive:  Negative.  Other: No pelvic free fluid.  Musculoskeletal: Degenerative changes in the spine. No acute osseous abnormality identified.  IMPRESSION: 1. Positive for Acute Choledocholithiasis with multiple gallstones in the CBD. Dilated CBD up to 20 mm and intrahepatic ducts. Distal duct impacted stone estimated at 16 mm diameter. Superimposed cholelithiasis. Gastroenterology consultation recommended. 2. No pancreatic inflammation. No other acute process identified in the abdomen or pelvis. 3. Diverticulosis of the large bowel. Aortic Atherosclerosis (ICD10-I70.0) and Emphysema  (ICD10-J43.9).   Electronically Signed   By: Odessa Fleming M.D.   On: 06/05/2018 06:25 Assessment:      choledocolithiasis  Plan:     With the degree of obstruction noted with multiple stones still present on CT, proceeding with lap chole during this admission will be best.  At this point, benefits with proceeding will outweigh increased risk of COVID-19 exposure to both patient and staff.   Discussed the risk of surgery including post-op infxn, seroma, biloma, chronic pain, poor-delayed wound healing, retained gallstone, conversion to open procedure, post-op SBO or ileus, and need for additional procedures to address said risks.  The risks of general anesthetic including MI, CVA, sudden death or even reaction to anesthetic medications also discussed. Alternatives include continued observation.  Benefits include possible symptom relief, prevention of complications including acute cholecystitis, pancreatitis.  Typical post operative recovery of 3-5 days rest, continued pain in area and incision sites, possible loose stools up to 4-6 weeks, also discussed.  The patient understands the risks, any and all questions were answered to the patient's satisfaction.  Will proceed with lap chole tomorrow after ERCP is completed.  NPO after midnight.

## 2018-06-05 NOTE — Anesthesia Post-op Follow-up Note (Signed)
Anesthesia QCDR form completed.        

## 2018-06-05 NOTE — ED Notes (Signed)
Updated daughter Penny Johnson that patient will be staying in the hospital

## 2018-06-05 NOTE — ED Notes (Addendum)
ED TO INPATIENT HANDOFF REPORT  ED Nurse Name and Phone #: Shanda BumpsJessica  [phone]   S Name/Age/Gender Penny ParsleyJosephine Johnson 78 y.o. female Room/Bed: ED24A/ED24A  Code Status   Code Status: Full Code  Home/SNF/Other Home Patient oriented to: self, place, time and situation Is this baseline? Yes   Triage Complete: Triage complete  Chief Complaint [facility] Abd Pain  Triage Note Patient coming from home via [facility] for abd pain that started right around midnight. Patient has had some nausea\. Denies fever. Patient has COPD and is chronically on 2L [place] at home.    Allergies No Known Allergies  Level of Care/Admitting Diagnosis ED Disposition    ED Disposition Condition Comment   Admit  Hospital Area: [facility] [[id number]]  Level of Care: Med-Surg [16]  Covid Evaluation: N/A  Diagnosis: Choledocholithiasis [960454][[id number]]  Admitting Physician: Jama FlavorsJIE, JUDE [[id number]]  Attending Physician: Jama FlavorsJIE, JUDE [[id number]]  Estimated length of stay: past midnight tomorrow  Certification:: I certify this patient will need inpatient services for at least 2 midnights  PT Class (Do Not Modify): Inpatient [101]  PT Acc Code (Do Not Modify): Private [1]       B Medical/Surgery History Past Medical History:  Diagnosis Date  . COPD (chronic obstructive pulmonary disease) (HCC)    History reviewed. No pertinent surgical history.   A IV Location/Drains/Wounds Patient Lines/Drains/Airways Status   Active Line/Drains/Airways    Name:   Placement date:   Placement time:   Site:   Days:   Peripheral IV 06/05/18 Right Wrist   06/05/18    0506    Wrist   less than 1          Intake/Output Last 24 hours No intake or output data in the 24 hours ending 06/05/18 0802  Labs/Imaging Results for orders placed or performed during the hospital encounter of 06/05/18 (from the past 48 hour(s))  CBC with Differential     Status: Abnormal   Collection Time: 06/05/18  5:03 AM  Result Value Ref Range   WBC  12.2 (H) 4.0 - 10.5 K/uL   RBC 4.07 3.87 - 5.11 MIL/uL   Hemoglobin 12.0 12.0 - 15.0 g/dL   HCT 09.836.4 11.936.0 - 14.746.0 %   MCV 89.4 80.0 - 100.0 fL   MCH 29.5 26.0 - 34.0 pg   MCHC 33.0 30.0 - 36.0 g/dL   RDW 82.913.2 56.211.5 - 13.015.5 %   Platelets 243 150 - 400 K/uL   nRBC 0.0 0.0 - 0.2 %   Neutrophils Relative % 89 %   Neutro Abs 10.7 (H) 1.7 - 7.7 K/uL   Lymphocytes Relative 5 %   Lymphs Abs 0.6 (L) 0.7 - 4.0 K/uL   Monocytes Relative 6 %   Monocytes Absolute 0.7 0.1 - 1.0 K/uL   Eosinophils Relative 0 %   Eosinophils Absolute 0.0 0.0 - 0.5 K/uL   Basophils Relative 0 %   Basophils Absolute 0.0 0.0 - 0.1 K/uL   Immature Granulocytes 0 %   Abs Immature Granulocytes 0.05 0.00 - 0.07 K/uL    Comment: Performed at [facility], [address] [place], [place] [address]  Comprehensive metabolic panel     Status: Abnormal   Collection Time: 06/05/18  5:03 AM  Result Value Ref Range   Sodium 134 (L) 135 - 145 mmol/L   Potassium 3.2 (L) 3.5 - 5.1 mmol/L   Chloride 96 (L) 98 - 111 mmol/L   CO2 27 22 - 32 mmol/L  Glucose, Bld 165 (H) 70 - 99 mg/dL   BUN 12 8 - 23 mg/dL   Creatinine, Ser 1.61 (H) 0.44 - 1.00 mg/dL   Calcium 9.0 8.9 - 09.6 mg/dL   Total Protein 7.1 6.5 - 8.1 g/dL   Albumin 4.2 3.5 - 5.0 g/dL   AST 045 (H) 15 - 41 U/L   ALT 131 (H) 0 - 44 U/L   Alkaline Phosphatase 170 (H) 38 - 126 U/L   Total Bilirubin 1.9 (H) 0.3 - 1.2 mg/dL   GFR calc non Af Amer 48 (L) >60 mL/min   GFR calc Af Amer 55 (L) >60 mL/min   Anion gap 11 5 - 15    Comment: Performed at [facility], [address] [place], [place] [address]  Lipase, blood     Status: None   Collection Time: 06/05/18  5:03 AM  Result Value Ref Range   Lipase 36 11 - 51 U/L    Comment: Performed at [facility], [address], [place], [place] [address]   Ct Abdomen Pelvis W Contrast  Result Date: 06/05/2018 CLINICAL DATA:  78 year old female with abdominal pain since midnight. Nausea. On home  oxygen. EXAM: CT ABDOMEN AND PELVIS WITH CONTRAST TECHNIQUE: Multidetector CT imaging of the abdomen and pelvis was performed using the standard protocol following bolus administration of intravenous contrast. CONTRAST:  OMNIPAQUE IOHEXOL 300 MG/ML  SOLN COMPARISON:  Chest radiographs 03/04/2018 and earlier. FINDINGS: Lower chest: Centrilobular emphysema at the lung bases. No confluent lower lung opacity, pleural or pericardial effusion. Calcified granuloma in the left posterior costophrenic angle. Hepatobiliary: Multiple gallstones measuring up to 20 millimeters diameter. There are less calcified stones impacted within the common bile duct seen on coronal images 34 through 36. The distal impacted stone is estimated at 16 millimeters diameter. The duct is dilated from 11-20 millimeters diameter. Superimposed generalized intrahepatic biliary ductal dilatation. No pericholecystic inflammatory stranding at this time. No discrete liver lesion. Pancreas: Main pancreatic duct remains within normal limits. No pancreatic inflammation identified. Spleen: Negative (small calcified granuloma). Adrenals/Urinary Tract: Negative adrenal glands. Bilateral renal enhancement and contrast excretion is symmetric and normal. Occasional renal cortical scarring. Decompressed proximal ureters. Unremarkable urinary bladder. Pelvic phleboliths. Stomach/Bowel: No dilated large or small bowel. Diverticulosis of the sigmoid colon and descending colon with no active inflammation. Redundant large bowel. The transverse colon is decompressed. Occasional transverse colon diverticula. Negative terminal ileum. Normal appendix on series 2, image 61. Mildly fluid distended stomach. Small gastric hiatal hernia. No free air, free fluid. Vascular/Lymphatic: Extensive Aortoiliac calcified atherosclerosis. Major arterial structures are patent. Portal venous system is patent. Reproductive: Negative. Other: No pelvic free fluid. Musculoskeletal:  Degenerative changes in the spine. No acute osseous abnormality identified. IMPRESSION: 1. Positive for Acute Choledocholithiasis with multiple gallstones in the CBD. Dilated CBD up to 20 mm and intrahepatic ducts. Distal duct impacted stone estimated at 16 mm diameter. Superimposed cholelithiasis. Gastroenterology consultation recommended. 2. No pancreatic inflammation. No other acute process identified in the abdomen or pelvis. 3. Diverticulosis of the large bowel. Aortic Atherosclerosis (ICD10-I70.0) and Emphysema (ICD10-J43.9). Electronically Signed   By: Odessa Fleming M.D.   On: 06/05/2018 06:25    Pending Labs Unresulted Labs (From admission, onward)    Start     Ordered   06/05/18 0518  Urinalysis, Complete w Microscopic  Once,   STAT     06/05/18 0518          Vitals/Pain Today's Vitals   06/05/18 8295  06/05/18 0741 06/05/18 0742 06/05/18 0743  BP: (!) 145/62     Pulse: 96     Resp:      Temp: 99.7 F (37.6 C)     TempSrc: Oral     SpO2: 94% (!) 75% (!) 88% 96%  Weight:      Height:      PainSc: 0-No pain       Isolation Precautions No active isolations  Medications Medications  0.9 %  sodium chloride infusion (has no administration in time range)  morphine 2 MG/ML injection 2 mg (2 mg Intravenous Given 06/05/18 0510)  ondansetron (ZOFRAN) injection 4 mg (4 mg Intravenous Given 06/05/18 0510)  iohexol (OMNIPAQUE) 300 MG/ML solution 100 mL (100 mLs Intravenous Contrast Given 06/05/18 0547)  ipratropium-albuterol (DUONEB) 0.5-2.5 (3) MG/3ML nebulizer solution 3 mL (3 mLs Nebulization Given 06/05/18 9233)  ipratropium-albuterol (DUONEB) 0.5-2.5 (3) MG/3ML nebulizer solution 3 mL (3 mLs Nebulization Given 06/05/18 0076)  sodium chloride 0.9 % bolus 500 mL (500 mLs Intravenous New Bag/Given 06/05/18 0738)    Mobility walks with device Low fall risk   Focused Assessments Pulmonary Assessment Handoff:  Lung sounds: Bilateral Breath Sounds: Expiratory wheezes O2 Device: Nasal  Cannula O2 Flow Rate (L/min): 4 L/min      R Recommendations: See Admitting Provider Note  Report given to:   Additional Notes: Patient being admitted for gallstone, hx COPD, labored breathing, neb treatments given in ER

## 2018-06-05 NOTE — H&P (Addendum)
[place] [place] NAME: Penny Johnson    MR#:  [id number]  DATE OF BIRTH:  03-Mar-1940  DATE OF ADMISSION:  06/05/2018  PRIMARY CARE PHYSICIAN: Department, [facility]   REQUESTING/REFERRING PHYSICIAN: Marjean Donna, MD  CHIEF COMPLAINT:   Chief Complaint  Patient presents with  . Abdominal Pain    HISTORY OF PRESENT ILLNESS:  Penny Johnson  is a 78 y.o. female with a known history of COPD on oxygen supplementation, history of coal dust exposure due to working at a [employer], nephrolithiasis, osteoporosis, hyperlipidemia, hypertension, and pneumonia presenting to the ED with chief complaints of right upper quadrant pain with onset around midnight.  Patient reports she has been having intermittent pain for the past few days with associated symptoms of nausea. Denies fevers or chills, vomiting, diarrhea, shortness of breath, chest pain, or any other associated GI symptoms.  On arrival to the ED, she was afebrile with blood pressure 142/94 mm Hg and pulse rate 107 beats/min. There were no focal neurological deficits; she was alert and oriented x4, but was noted to be short of breath with increased work of breathing.  Her oxygen saturation was 88% on room air therefore placed on oxygen via nasal cannula.  Initial pertinent labs revealed elevated WBC of 12.2, K+ 3.2, Na 134, glucose 165, creatinine 1.12, AST 431, ALT 131, alk phos 170, bilirubin 1.9 GFR 48; she was evaluated in the context of the global COVID-19 pandemic, which necessitated consideration that the patient might be at risk for infection with the SARS-CoV-2 virus that causes COVID-19 and was found to be at low risk. ECG showed sinus rhythm 69 beats per minute.  CT abdomen pelvis was obtained and showed Positive for Acute Choledocholithiasis with multiple gallstones in the CBD. Dilated CBD up to 20 mm and intrahepatic ducts. Distal duct impacted stone estimated at 16 mm  diameter with superimposed cholelithiasis.  PAST MEDICAL HISTORY:   Past Medical History:  Diagnosis Date  . COPD (chronic obstructive pulmonary disease) ([place])   . Hyperlipidemia   . Hypertension   . Nephrolithiasis   . Pneumonia    PAST SURGICAL HISTORY:  History reviewed. No pertinent surgical history.  SOCIAL HISTORY:   Social History   Tobacco Use  . Smoking status: Not on file  Substance Use Topics  . Alcohol use: Not on file   FAMILY HISTORY:   Myocardial Infarction (Heart attack) Brother    Alzheimer's disease Father    High blood pressure (Hypertension) Mother    Skin cancer Mother     DRUG ALLERGIES:   Allergies  Allergen Reactions  . Theophylline Palpitations   REVIEW OF SYSTEMS:   Review of Systems  Constitutional: Negative for chills, fever, malaise/fatigue and weight loss.  HENT: Negative for congestion, hearing loss and sore throat.   Eyes: Negative for blurred vision and double vision.  Respiratory: Negative for cough, hemoptysis, sputum production, shortness of breath and wheezing.   Cardiovascular: Negative for chest pain, palpitations, orthopnea and leg swelling.  Gastrointestinal: Positive for abdominal pain and nausea. Negative for blood in stool, constipation, diarrhea, melena and vomiting.  Genitourinary: Positive for flank pain. Negative for dysuria, hematuria and urgency.  Musculoskeletal: Negative for myalgias.  Skin: Negative for rash.  Neurological: Negative for dizziness, sensory change, speech change, focal weakness and headaches.  Psychiatric/Behavioral: Negative for depression.   MEDICATIONS AT HOME:   Prior to Admission medications   Not on File  Prior to Admission medications   Medication Sig Start Date End Date Taking? Authorizing Provider  acetaminophen (TYLENOL) 325 MG tablet Take 650 mg by mouth every 4 (four) hours as needed for pain. 05/28/17  Yes [provider]  albuterol (PROVENTIL) (2.5 MG/3ML)  0.083% nebulizer solution Inhale 3 mLs into the lungs 4 (four) times daily. 03/17/18 03/17/19 Yes [provider]  albuterol (VENTOLIN HFA) 108 (90 Base) MCG/ACT inhaler Inhale 2 puffs into the lungs every 4 (four) hours as needed for wheezing. 04/24/17  Yes [provider]  alendronate (FOSAMAX) 70 MG tablet Take 70 mg by mouth once a week. 02/25/18  Yes [provider]  azelastine (ASTELIN) 0.1 % nasal spray Place 1-2 sprays into both nostrils 2 (two) times daily. 07/10/17 07/10/18 Yes [provider]  citalopram (CELEXA) 40 MG tablet Take 20 mg by mouth 2 (two) times daily.  03/17/18  Yes [provider]  Fluticasone-Salmeterol (ADVAIR) 250-50 MCG/DOSE AEPB Inhale 1 puff into the lungs 2 (two) times daily. 05/16/18  Yes [provider]  furosemide (LASIX) 40 MG tablet Take 40 mg by mouth 3 (three) times a week.  01/27/18  Yes [provider]  ipratropium (ATROVENT) 0.02 % nebulizer solution Inhale 2.5 mLs into the lungs 4 (four) times daily. 03/17/18  Yes [provider]  meclizine (ANTIVERT) 25 MG tablet Take 25 mg by mouth 3 (three) times daily. 01/01/18  Yes [provider]  metoprolol tartrate (LOPRESSOR) 25 MG tablet Take 12.5 mg by mouth daily at 2 PM.   Yes [provider]  sertraline (ZOLOFT) 25 MG tablet Take 25 mg by mouth daily. 03/06/18  Yes [provider]  simvastatin (ZOCOR) 40 MG tablet Take 20 mg by mouth daily.  05/16/18  Yes [provider]  SPIRIVA HANDIHALER 18 MCG inhalation capsule Place 1 capsule into inhaler and inhale daily. 02/25/18  Yes [provider]  fexofenadine (ALLEGRA) 60 MG tablet Take 60 mg by mouth daily.    [provider]   VITAL SIGNS:  Blood pressure (!) 83/66, pulse 93, temperature 97.8 F (36.6 C), resp. rate 18, height '5\' 3"'$  (1.6 m), weight 69.3 kg, SpO2 98 %.  PHYSICAL EXAMINATION:   GENERAL:  78 y.o.-year-old patient lying in the bed with  no acute distress.  EYES: Pupils equal, round, reactive to light and accommodation. Mild scleral icterus. Extraocular muscles intact.  HEENT: Head atraumatic, normocephalic. Oropharynx and nasopharynx clear.  NECK:  Supple, no jugular venous distention. No thyroid enlargement, no tenderness.  LUNGS: Normal breath sounds bilaterally, no wheezing, rales,rhonchi or crepitation. No use of accessory muscles of respiration.  CARDIOVASCULAR: S1, S2 normal. No murmurs, rubs, or gallops.  ABDOMEN:Mid and RUQ tenderness, nondistended. Bowel sounds present. No organomegaly or mass.  EXTREMITIES: Dependent edema of bilateral legs, cyanosis, or clubbing.  NEUROLOGIC: Mental Status:Alert, oriented, thought content appropriate.  Speech fluent without evidence of aphasia.  Able to follow 3 step commands without difficulty. Attention span and concentration seemed appropriate  Cranial Nerves: II: Discs flat bilaterally; Visual fields grossly normal, pupils equal, round, reactive to light and accommodation III,IV, VI: ptosis not present, extra-ocular motions intact bilaterally V,VII: smile symmetric, facial light touch sensation intact VIII: hearing normal bilaterally IX,X: gag reflex present XI: bilateral shoulder shrug XII: midline tongue extension Muscle strength 5/5 in all extremities.  Tone and bulk:normal tone throughout; no atrophy noted Sensory: Pinprick and light touch intact bilaterally Deep Tendon Reflexes: 2+ and symmetric throughout Cerebellar:Absent ataxia Gait: not tested  due to safety concerns PSYCHIATRIC: The patient is alert and oriented x 3.  SKIN: No obvious rash, lesion, or ulcer.   DATA REVIEWED:  LABORATORY PANEL:   CBC Recent Labs  Lab 06/05/18 0503  WBC 12.2*  HGB 12.0  HCT 36.4  PLT 243   ------------------------------------------------------------------------------------------------------------------  Chemistries  Recent Labs  Lab 06/05/18 0503  NA 134*  K 3.2*   CL 96*  CO2 27  GLUCOSE 165*  BUN 12  CREATININE 1.12*  CALCIUM 9.0  AST 431*  ALT 131*  ALKPHOS 170*  BILITOT 1.9*   ------------------------------------------------------------------------------------------------------------------  Cardiac Enzymes No results for input(s): TROPONINI in the last 168 hours. ------------------------------------------------------------------------------------------------------------------  RADIOLOGY:  Ct Abdomen Pelvis W Contrast  Result Date: 06/05/2018 CLINICAL DATA:  78 year old female with abdominal pain since midnight. Nausea. On home oxygen. EXAM: CT ABDOMEN AND PELVIS WITH CONTRAST TECHNIQUE: Multidetector CT imaging of the abdomen and pelvis was performed using the standard protocol following bolus administration of intravenous contrast. CONTRAST:  146m OMNIPAQUE IOHEXOL 300 MG/ML  SOLN COMPARISON:  Chest radiographs 03/04/2018 and earlier. FINDINGS: Lower chest: Centrilobular emphysema at the lung bases. No confluent lower lung opacity, pleural or pericardial effusion. Calcified granuloma in the left posterior costophrenic angle. Hepatobiliary: Multiple gallstones measuring up to 20 millimeters diameter. There are less calcified stones impacted within the common bile duct seen on coronal images 34 through 36. The distal impacted stone is estimated at 16 millimeters diameter. The duct is dilated from 11-20 millimeters diameter. Superimposed generalized intrahepatic biliary ductal dilatation. No pericholecystic inflammatory stranding at this time. No discrete liver lesion. Pancreas: Main pancreatic duct remains within normal limits. No pancreatic inflammation identified. Spleen: Negative (small calcified granuloma). Adrenals/Urinary Tract: Negative adrenal glands. Bilateral renal enhancement and contrast excretion is symmetric and normal. Occasional renal cortical scarring. Decompressed proximal ureters. Unremarkable urinary bladder. Pelvic phleboliths.  Stomach/Bowel: No dilated large or small bowel. Diverticulosis of the sigmoid colon and descending colon with no active inflammation. Redundant large bowel. The transverse colon is decompressed. Occasional transverse colon diverticula. Negative terminal ileum. Normal appendix on series 2, image 61. Mildly fluid distended stomach. Small gastric hiatal hernia. No free air, free fluid. Vascular/Lymphatic: Extensive Aortoiliac calcified atherosclerosis. Major arterial structures are patent. Portal venous system is patent. Reproductive: Negative. Other: No pelvic free fluid. Musculoskeletal: Degenerative changes in the spine. No acute osseous abnormality identified. IMPRESSION: 1. Positive for Acute Choledocholithiasis with multiple gallstones in the CBD. Dilated CBD up to 20 mm and intrahepatic ducts. Distal duct impacted stone estimated at 16 mm diameter. Superimposed cholelithiasis. Gastroenterology consultation recommended. 2. No pancreatic inflammation. No other acute process identified in the abdomen or pelvis. 3. Diverticulosis of the large bowel. Aortic Atherosclerosis (ICD10-I70.0) and Emphysema (ICD10-J43.9). Electronically Signed   By: HGenevie AnnM.D.   On: 06/05/2018 06:25    EKG:  EKG: normal EKG, normal sinus rhythm, unchanged from previous tracings.  Vent. rate 69 BPM PR interval * ms QRS duration 100 ms QT/QTc 466/500 ms P-R-T axes 68 50 * IMPRESSION AND PLAN:   78y.o. female COPD on home oxygen supplementation, history of coal dust exposure due to working at a [employer] nephrolithiasis, osteoporosis, hyperlipidemia, hypertension, and pneumonia presenting to the ED with chief complaints of right upper quadrant pain and acute respiratory insufficiency.  1. Acute Choledocholithiasis - CT abdomen and pelvis showing multiple gallstones in the CBD. Dilated CBD up to 20 mm and intrahepatic ducts. Distal duct impacted stone estimated at 16 mm diameter with superimposed cholelithiasis. -Admit  to  medsurg floor with telemetry monitoring -No signs or symptoms of ascending cholangitis (Fever, obstructive jaundice, although with RUQ pain)  also has elevated LFTs and bilirubin. -GI /general surgery consulted for possible ERCP -Will keep NPO for possible ERCP -Start IV Zosyn -Hydrate with IVFs -PRN analgesic  2. Acute on chronic COPD Exacerbation - patient with hx of COPD on home oxygen at 2L - Supplemental O2, goal sat 88-92% - Bronchodilators (albuterol/ipratropium) standing and PRN  - Corticosteroids: IV steroids 40 mg Q12 - No risk factors for Pseudomonas (recent hospitalization within 90 days, frequent antibiotics >4 courses/year, severe COPD, prior documented Pseudomonas) or persistent symptoms in spite of empiric antibiotics.) - Pulmonary rehabilitation referral at discharge if appropriate  3. Acute Kidney Injury - Likely prerenal in the setting of acute Choledocholithiasis -Hydrate with IVFs -Hold nephrotoxins -Follow labs  4. Hypokalemia- Repleted  -Recheck am Labs  5. Elevated liver function test-secondary to cholestasis secondary to above    6. Cholelithiasis- Unable to perform cholecystectomy during this admission given the current COVID-19 pandemic the [facility]  recommending conservative treatment.  -Will need cholecystectomy as outpatient.  7. DVT prophylaxis - Holding for possible ERCP today. SCDs   All the records are reviewed and case discussed with ED provider. Management plans discussed with the patient, family and they are in agreement.  CODE STATUS: FULL  TOTAL TIME TAKING CARE OF THIS PATIENT: 45 minutes.    on 06/05/2018 at 11:13 AM  This patient was staffed with Dr. Stark Jock, Jude who personally evaluated patient, reviewed documentation and agreed with assessment and plan of care as above.  Rufina Falco, DNP, FNP-BC [facility] Nurse Practitioner Between 7am to 6pm - Pager [phone]  After 6pm go to [email or web address] - password [email or web address] [facility]  Office  [phone]  CC: Primary care physician; Department, [facility]

## 2018-06-05 NOTE — Transfer of Care (Signed)
Immediate Anesthesia Transfer of Care Note  Patient: Penny Johnson  Procedure(s) Performed: ENDOSCOPIC RETROGRADE CHOLANGIOPANCREATOGRAPHY (ERCP) (N/A )  Patient Location: PACU  Anesthesia Type:General  Level of Consciousness: sedated  Airway & Oxygen Therapy: Patient Spontanous Breathing and Patient connected to face mask oxygen  Post-op Assessment: Report given to RN and Post -op Vital signs reviewed and stable  Post vital signs: Reviewed and stable  Last Vitals:  Vitals Value Taken Time  BP 118/49   Temp    Pulse 88 06/05/2018  5:31 PM  Resp 21 06/05/2018  5:31 PM  SpO2 96 % 06/05/2018  5:31 PM  Vitals shown include unvalidated device data.  Last Pain:  Vitals:   06/05/18 1552  TempSrc: Tympanic  PainSc: 0-No pain         Complications: No apparent anesthesia complications

## 2018-06-05 NOTE — Op Note (Signed)
[facility] Patient Name: Penny Johnson Procedure Date: 06/05/2018 4:01 PM MRN: [id number] Account #: [id number] Date of Birth: 07-16-1940 Admit Type: Outpatient Age: 78 Room: [facility] ENDO ROOM 4 Gender: Female Note Status: Finalized Procedure:            ERCP Indications:          Bile duct stone(s), Abnormal abdominal CT Providers:            Midge Minium MD, MD Medicines:            General Anesthesia Complications:        No immediate complications. Procedure:            Pre-Anesthesia Assessment:                       - Prior to the procedure, a History and Physical was                        performed, and patient medications and allergies were                        reviewed. The patient's tolerance of previous                        anesthesia was also reviewed. The risks and benefits of                        the procedure and the sedation options and risks were                        discussed with the patient. All questions were                        answered, and informed consent was obtained. Prior                        Anticoagulants: The patient has taken no previous                        anticoagulant or antiplatelet agents. ASA Grade                        Assessment: III - A patient with severe systemic                        disease. After reviewing the risks and benefits, the                        patient was deemed in satisfactory condition to undergo                        the procedure.                       After obtaining informed consent, the scope was passed                        under direct vision. Throughout the procedure, the                        patient's blood pressure,  pulse, and oxygen saturations                        were monitored continuously. The Duodenoscope was                        introduced through the mouth, and used to inject                        contrast into and used to inject contrast  into the bile                        duct and ventral pancreatic duct. The ERCP was                        accomplished without difficulty. The patient tolerated                        the procedure well. Findings:      The scout film was normal. The major papilla was on the rim of a       diverticulum. The bile duct was deeply cannulated with the short-nosed       traction sphincterotome. Contrast was injected. I personally interpreted       the bile duct and pancreatic duct images. There was brisk flow of       contrast through the ducts. Image quality was excellent. Contrast       extended to the entire biliary tree. The main bile duct was diffusely       dilated. The largest diameter was 20 mm. The main bile duct contained       filling defect(s) thought to be a stones. A 10 mm biliary sphincterotomy       was made with a traction (standard) sphincterotome using ERBE       electrocautery. The sphincterotomy oozed blood. The biliary tree was       swept with a 15 mm balloon starting at the bifurcation. The bile duct       was deeply cannulated with the short-nosed traction sphincterotome.       Contrast was injected. The in the biliary system was normal. The biliary       tree was swept with a 15 mm balloon starting at the bifurcation. Sludge       was swept from the duct. Many stones were removed. One stone remained.       The biliary tree was swept with a basket starting at the bifurcation.       One stone was removed. No stones remained. Impression:           - The major papilla was on the rim of a diverticulum.                       - A filling defect consistent with a stone was seen on                        the cholangiogram.                       - The entire main bile duct was dilated.                       -  Choledocholithiasis was found. Partial removal was                        accomplished by biliary sphincterotomy; no stent was                        inserted.                        - A biliary sphincterotomy was performed.                       - The biliary tree was swept. Recommendation:       - Return patient to hospital ward for ongoing care.                       - Clear liquid diet today. Procedure Code(s):    --- Professional ---                       [phone], Endoscopic retrograde cholangiopancreatography                        (ERCP); with removal of calculi/debris from                        biliary/pancreatic duct(s)                       43262, Endoscopic retrograde cholangiopancreatography                        (ERCP); with sphincterotomy/papillotomy                       [phone], Combined endoscopic catheterization of the                        biliary and pancreatic ductal systems, radiological                        supervision and interpretation Diagnosis Code(s):    --- Professional ---                       K80.50, Calculus of bile duct without cholangitis or                        cholecystitis without obstruction                       K83.8, Other specified diseases of biliary tract                       R93.5, Abnormal findings on diagnostic imaging of other                        abdominal regions, including retroperitoneum                       R93.2, Abnormal findings on diagnostic imaging of liver                        and biliary tract CPT copyright 2019 American Medical Association. All rights reserved. The codes  documented in this report are preliminary and upon coder review may  be revised to meet current compliance requirements. Midge Minium MD, MD 06/05/2018 5:33:01 PM This report has been signed electronically. Number of Addenda: 0 Note Initiated On: 06/05/2018 4:01 PM      [facility]

## 2018-06-05 NOTE — Anesthesia Postprocedure Evaluation (Signed)
Anesthesia Post Note  Patient: Penny Johnson  Procedure(s) Performed: ENDOSCOPIC RETROGRADE CHOLANGIOPANCREATOGRAPHY (ERCP) (N/A )  Patient location during evaluation: PACU Anesthesia Type: General Level of consciousness: awake and alert Pain management: pain level controlled Vital Signs Assessment: post-procedure vital signs reviewed and stable Respiratory status: spontaneous breathing, nonlabored ventilation and respiratory function stable Cardiovascular status: blood pressure returned to baseline and stable Postop Assessment: no apparent nausea or vomiting Anesthetic complications: no     Last Vitals:  Vitals:   06/05/18 1944 06/05/18 1951  BP: (!) 103/39   Pulse: 77   Resp: (!) 22   Temp: (!) 36.3 C   SpO2: 95% 94%    Last Pain:  Vitals:   06/05/18 1944  TempSrc: Oral  PainSc:                  Jovita Gamma

## 2018-06-05 NOTE — ED Notes (Signed)
Patient transported to CT 

## 2018-06-05 NOTE — Anesthesia Procedure Notes (Signed)
Procedure Name: Intubation Date/Time: 06/05/2018 4:24 PM Performed by: Dionne Bucy, CRNA Pre-anesthesia Checklist: Patient identified, Patient being monitored, Timeout performed, Emergency Drugs available and Suction available Patient Re-evaluated:Patient Re-evaluated prior to induction Oxygen Delivery Method: Circle system utilized Preoxygenation: Pre-oxygenation with 100% oxygen Induction Type: IV induction Ventilation: Mask ventilation without difficulty Laryngoscope Size: Mac and 3 Grade View: Grade I Tube type: Oral Tube size: 7.0 mm Number of attempts: 1 Airway Equipment and Method: Stylet and Video-laryngoscopy Placement Confirmation: ETT inserted through vocal cords under direct vision,  positive ETCO2 and breath sounds checked- equal and bilateral Secured at: 21 cm Tube secured with: Tape Dental Injury: Teeth and Oropharynx as per pre-operative assessment

## 2018-06-05 NOTE — ED Provider Notes (Signed)
[facility] Emergency Department Provider Note   First MD Initiated Contact with Patient 06/05/18 0502     (approximate)  I have reviewed the triage vital signs and the nursing notes.   HISTORY  Chief Complaint Abdominal Pain   HPI Penny Johnson is a 78 y.o. female with medical history as listed below leading COPD and diverticulitis presents to the emergency department acute onset 10 AM of 10 out of 10 infraumbilical pain with associated nausea.  Patient denies any diarrhea no constipation.  Patient denies any urinary symptoms.        Past Medical History:  Diagnosis Date   COPD (chronic obstructive pulmonary disease) (HCC)     Patient Active Problem List   Diagnosis Date Noted   Choledocholithiasis 06/05/2018    History reviewed. No pertinent surgical history.  Prior to Admission medications   Not on File    Allergies Patient has no known allergies.  No family history on file.  Social History Social History   Tobacco Use   Smoking status: Not on file  Substance Use Topics   Alcohol use: Not on file   Drug use: Not on file    Review of Systems Constitutional: No fever/chills Eyes: No visual changes. ENT: No sore throat. Cardiovascular: Denies chest pain. Respiratory: Denies shortness of breath. Gastrointestinal: Positive for abdominal pain and nausea Genitourinary: Negative for dysuria. Musculoskeletal: Negative for neck pain.  Negative for back pain. Integumentary: Negative for rash. Neurological: Negative for headaches, focal weakness or numbness.   ____________________________________________   PHYSICAL EXAM:  VITAL SIGNS: ED Triage Vitals  Enc Vitals Group     BP 06/05/18 0501 (!) 142/94     Pulse Rate 06/05/18 0501 71     Resp 06/05/18 0501 19     Temp 06/05/18 0501 97.9 F (36.6 C)     Temp Source 06/05/18 0501 Oral     SpO2 06/05/18 0501 95 %     Weight 06/05/18 0505 63.5 kg (140 lb)     Height  06/05/18 0505 1.6 m (5\' 3" )     Head Circumference --      Peak Flow --      Pain Score 06/05/18 0503 9     Pain Loc --      Pain Edu? --      Excl. in GC? --     Constitutional: Alert and oriented. Well appearing and in no acute distress. Eyes: Conjunctivae are normal.  Mouth/Throat: Mucous membranes are moist.  Oropharynx non-erythematous. Neck: No stridor.   Cardiovascular: Normal rate, regular rhythm. Good peripheral circulation. Grossly normal heart sounds. Respiratory: Normal respiratory effort.  No retractions. No audible wheezing. Gastrointestinal: Right upper quadrant/left lower quadrant tenderness to palpation. No distention.  Musculoskeletal: No lower extremity tenderness nor edema. No gross deformities of extremities. Neurologic:  Normal speech and language. No gross focal neurologic deficits are appreciated.  Skin:  Skin is warm, dry and intact. No rash noted. Psychiatric: Mood and affect are normal. Speech and behavior are normal.  ____________________________________________   LABS (all labs ordered are listed, but only abnormal results are displayed)  Labs Reviewed  CBC WITH DIFFERENTIAL/PLATELET - Abnormal; Notable for the following components:      Result Value   WBC 12.2 (*)    Neutro Abs 10.7 (*)    Lymphs Abs 0.6 (*)    All other components within normal limits  COMPREHENSIVE METABOLIC PANEL - Abnormal; Notable for the following components:   Sodium 134 (*)  Potassium 3.2 (*)    Chloride 96 (*)    Glucose, Bld 165 (*)    Creatinine, Ser 1.12 (*)    AST 431 (*)    ALT 131 (*)    Alkaline Phosphatase 170 (*)    Total Bilirubin 1.9 (*)    GFR calc non Af Amer 48 (*)    GFR calc Af Amer 55 (*)    All other components within normal limits  LIPASE, BLOOD  URINALYSIS, COMPLETE (UACMP) WITH MICROSCOPIC   ____________________________________________  EKG  ED ECG REPORT I, Brooks N Kendrah Lovern, the attending physician, personally viewed and interpreted  this ECG.   Date: 06/05/2018  EKG Time: 5:06 AM  Rate: 69  Rhythm: Normal sinus rhythm  Axis: Normal  Intervals: Normal  ST&T Change: None  ____________________________________________  RADIOLOGY I, Negley N Charelle Petrakis, personally viewed and evaluated these images (plain radiographs) as part of my medical decision making, as well as reviewing the written report by the radiologist.  ED MD interpretation:    Official radiology report(s): Ct Abdomen Pelvis W Contrast  Result Date: 06/05/2018 CLINICAL DATA:  78 year old female with abdominal pain since midnight. Nausea. On home oxygen. EXAM: CT ABDOMEN AND PELVIS WITH CONTRAST TECHNIQUE: Multidetector CT imaging of the abdomen and pelvis was performed using the standard protocol following bolus administration of intravenous contrast. CONTRAST:  OMNIPAQUE IOHEXOL 300 MG/ML  SOLN COMPARISON:  Chest radiographs 03/04/2018 and earlier. FINDINGS: Lower chest: Centrilobular emphysema at the lung bases. No confluent lower lung opacity, pleural or pericardial effusion. Calcified granuloma in the left posterior costophrenic angle. Hepatobiliary: Multiple gallstones measuring up to 20 millimeters diameter. There are less calcified stones impacted within the common bile duct seen on coronal images 34 through 36. The distal impacted stone is estimated at 16 millimeters diameter. The duct is dilated from 11-20 millimeters diameter. Superimposed generalized intrahepatic biliary ductal dilatation. No pericholecystic inflammatory stranding at this time. No discrete liver lesion. Pancreas: Main pancreatic duct remains within normal limits. No pancreatic inflammation identified. Spleen: Negative (small calcified granuloma). Adrenals/Urinary Tract: Negative adrenal glands. Bilateral renal enhancement and contrast excretion is symmetric and normal. Occasional renal cortical scarring. Decompressed proximal ureters. Unremarkable urinary bladder. Pelvic phleboliths.  Stomach/Bowel: No dilated large or small bowel. Diverticulosis of the sigmoid colon and descending colon with no active inflammation. Redundant large bowel. The transverse colon is decompressed. Occasional transverse colon diverticula. Negative terminal ileum. Normal appendix on series 2, image 61. Mildly fluid distended stomach. Small gastric hiatal hernia. No free air, free fluid. Vascular/Lymphatic: Extensive Aortoiliac calcified atherosclerosis. Major arterial structures are patent. Portal venous system is patent. Reproductive: Negative. Other: No pelvic free fluid. Musculoskeletal: Degenerative changes in the spine. No acute osseous abnormality identified. IMPRESSION: 1. Positive for Acute Choledocholithiasis with multiple gallstones in the CBD. Dilated CBD up to 20 mm and intrahepatic ducts. Distal duct impacted stone estimated at 16 mm diameter. Superimposed cholelithiasis. Gastroenterology consultation recommended. 2. No pancreatic inflammation. No other acute process identified in the abdomen or pelvis. 3. Diverticulosis of the large bowel. Aortic Atherosclerosis (ICD10-I70.0) and Emphysema (ICD10-J43.9). Electronically Signed   By: Odessa Fleming M.D.   On: 06/05/2018 06:25      .Critical Care Performed by: Darci Current, MD Authorized by: Darci Current, MD   Critical care provider statement:    Critical care time (minutes):  30   Critical care time was exclusive of:  Separately billable procedures and treating other patients   Critical care was necessary to treat or  prevent imminent or life-threatening deterioration of the following conditions:  Respiratory failure   Critical care was time spent personally by me on the following activities:  Development of treatment plan with patient or surrogate, discussions with consultants, evaluation of patient's response to treatment, examination of patient, obtaining history from patient or surrogate, ordering and performing treatments and  interventions, ordering and review of laboratory studies, ordering and review of radiographic studies, pulse oximetry, re-evaluation of patient's condition and review of old charts     ____________________________________________   INITIAL IMPRESSION / MDM / ASSESSMENT AND PLAN / ED COURSE  As part of my medical decision making, I reviewed the following data within the electronic MEDICAL RECORD NUMBER    78 year old female presented with above-stated history physical exam concerning for possible choledocholithiasis/cholecystitis.  CT scan of the abdomen pelvis revealed evidence of cholelithiasis and choledocholithiasis.  Patient's laboratory data notable for elevated liver enzymes and white blood cell count of 12.2.  Patient is afebrile.  Patient discussed with Dr. Enid Baasjie hospitalist who will admit the patient.  Patient also discussed with Dr. Servando SnareWohl gastroenterologist on-call who will evaluate the patient.  Of note patient had increased work of breathing while in the emergency department tachypneic with a respiratory rate of 36 at present and as such patient was given 2 DuoNeb's.  On reevaluation patient's work of breathing improved current respiratory rate 22  Penny Johnson was evaluated in Emergency Department on 06/05/2018 for the symptoms described in the history of present illness. She was evaluated in the context of the global COVID-19 pandemic, which necessitated consideration that the patient might be at risk for infection with the SARS-CoV-2 virus that causes COVID-19. Institutional protocols and algorithms that pertain to the evaluation of patients at risk for COVID-19 are in a state of rapid change based on information released by regulatory bodies including the CDC and federal and state organizations. These policies and algorithms were followed during the patient's care in the ED.        ____________________________________________  FINAL CLINICAL IMPRESSION(S) / ED DIAGNOSES  Final  diagnoses:  Choledocholithiasis  Calculus of gallbladder and bile duct with obstruction without cholecystitis  COPD exacerbation (HCC)     MEDICATIONS GIVEN DURING THIS VISIT:  Medications  0.9 %  sodium chloride infusion (has no administration in time range)  morphine 2 MG/ML injection 2 mg (2 mg Intravenous Given 06/05/18 0510)  ondansetron (ZOFRAN) injection 4 mg (4 mg Intravenous Given 06/05/18 0510)  iohexol (OMNIPAQUE) 300 MG/ML solution 100 mL (100 mLs Intravenous Contrast Given 06/05/18 0547)  ipratropium-albuterol (DUONEB) 0.5-2.5 (3) MG/3ML nebulizer solution 3 mL (3 mLs Nebulization Given 06/05/18 0638)  ipratropium-albuterol (DUONEB) 0.5-2.5 (3) MG/3ML nebulizer solution 3 mL (3 mLs Nebulization Given 06/05/18 0638)  sodium chloride 0.9 % bolus 500 mL (500 mLs Intravenous New Bag/Given 06/05/18 [id number])     ED Discharge Orders    None       Note:  This document was prepared using Dragon voice recognition software and may include unintentional dictation errors.   Darci CurrentBrown, [place] N, MD 06/06/18 [phone]

## 2018-06-05 NOTE — Anesthesia Preprocedure Evaluation (Addendum)
Anesthesia Evaluation  Patient identified by MRN, date of birth, ID band Patient awake    Reviewed: Allergy & Precautions, H&P , NPO status , Patient's Chart, lab work & pertinent test results  History of Anesthesia Complications (+) Emergence Delirium  Airway Mallampati: III  TM Distance: >3 FB     Dental  (+) Edentulous Lower, Edentulous Upper   Pulmonary COPD (2L O2 ATC at home),  COPD inhaler and oxygen dependent, neg recent URI, former smoker,           Cardiovascular hypertension, (-) angina(-) Past MI, (-) Cardiac Stents and (-) CABG (-) dysrhythmias      Neuro/Psych negative neurological ROS  negative psych ROS   GI/Hepatic negative GI ROS, Neg liver ROS,   Endo/Other  negative endocrine ROS  Renal/GU negative Renal ROS     Musculoskeletal   Abdominal   Peds  Hematology negative hematology ROS (+)   Anesthesia Other Findings Past Medical History: No date: COPD (chronic obstructive pulmonary disease) (HCC) No date: Hyperlipidemia No date: Hypertension No date: Nephrolithiasis No date: Pneumonia  History reviewed. No pertinent surgical history.  BMI    Body Mass Index:  27.06 kg/m      Reproductive/Obstetrics negative OB ROS                            Anesthesia Physical Anesthesia Plan  ASA: III  Anesthesia Plan: General ETT   Post-op Pain Management:    Induction:   PONV Risk Score and Plan: Ondansetron, Dexamethasone, Midazolam and Treatment may vary due to age or medical condition  Airway Management Planned:   Additional Equipment:   Intra-op Plan:   Post-operative Plan:   Informed Consent: I have reviewed the patients History and Physical, chart, labs and discussed the procedure including the risks, benefits and alternatives for the proposed anesthesia with the patient or authorized representative who has indicated his/her understanding and acceptance.      Dental Advisory Given  Plan Discussed with: Anesthesiologist and CRNA  Anesthesia Plan Comments:        Anesthesia Quick Evaluation

## 2018-06-06 ENCOUNTER — Encounter: Payer: Self-pay | Admitting: *Deleted

## 2018-06-06 ENCOUNTER — Inpatient Hospital Stay: Payer: Medicare HMO

## 2018-06-06 ENCOUNTER — Inpatient Hospital Stay: Payer: Medicare HMO | Admitting: Anesthesiology

## 2018-06-06 ENCOUNTER — Encounter
Admission: EM | Disposition: A | Discharge status: Home / Self Care | Payer: Self-pay | Source: Home / Self Care | Attending: Internal Medicine

## 2018-06-06 HISTORY — PX: CHOLECYSTECTOMY: SHX55

## 2018-06-06 LAB — CBC WITH DIFFERENTIAL/PLATELET
Abs Immature Granulocytes: 0.06 10*3/uL (ref 0.00–0.07)
Basophils Absolute: 0 10*3/uL (ref 0.0–0.1)
Basophils Relative: 0 %
Eosinophils Absolute: 0 10*3/uL (ref 0.0–0.5)
Eosinophils Relative: 0 %
HCT: 31.7 % — ABNORMAL LOW (ref 36.0–46.0)
Hemoglobin: 10.2 g/dL — ABNORMAL LOW (ref 12.0–15.0)
Immature Granulocytes: 1 %
Lymphocytes Relative: 3 %
Lymphs Abs: 0.3 10*3/uL — ABNORMAL LOW (ref 0.7–4.0)
MCH: 28.8 pg (ref 26.0–34.0)
MCHC: 32.2 g/dL (ref 30.0–36.0)
MCV: 89.5 fL (ref 80.0–100.0)
Monocytes Absolute: 0.2 10*3/uL (ref 0.1–1.0)
Monocytes Relative: 2 %
Neutro Abs: 11.1 10*3/uL — ABNORMAL HIGH (ref 1.7–7.7)
Neutrophils Relative %: 94 %
Platelets: 178 10*3/uL (ref 150–400)
RBC: 3.54 MIL/uL — ABNORMAL LOW (ref 3.87–5.11)
RDW: 13.3 % (ref 11.5–15.5)
WBC: 11.7 10*3/uL — ABNORMAL HIGH (ref 4.0–10.5)
nRBC: 0 % (ref 0.0–0.2)

## 2018-06-06 LAB — BASIC METABOLIC PANEL
Anion gap: 11 (ref 5–15)
BUN: 16 mg/dL (ref 8–23)
CO2: 24 mmol/L (ref 22–32)
Calcium: 8.1 mg/dL — ABNORMAL LOW (ref 8.9–10.3)
Chloride: 101 mmol/L (ref 98–111)
Creatinine, Ser: 1.24 mg/dL — ABNORMAL HIGH (ref 0.44–1.00)
GFR calc Af Amer: 49 mL/min — ABNORMAL LOW (ref >60)
GFR calc non Af Amer: 42 mL/min — ABNORMAL LOW (ref >60)
Glucose, Bld: 149 mg/dL — ABNORMAL HIGH (ref 70–99)
Potassium: 3.7 mmol/L (ref 3.5–5.1)
Sodium: 136 mmol/L (ref 135–145)

## 2018-06-06 LAB — HEPATIC FUNCTION PANEL
ALT: 223 U/L — ABNORMAL HIGH (ref 0–44)
AST: 311 U/L — ABNORMAL HIGH (ref 15–41)
Albumin: 3.2 g/dL — ABNORMAL LOW (ref 3.5–5.0)
Alkaline Phosphatase: 133 U/L — ABNORMAL HIGH (ref 38–126)
Bilirubin, Direct: 1.4 mg/dL — ABNORMAL HIGH (ref 0.0–0.2)
Indirect Bilirubin: 0.8 mg/dL (ref 0.3–0.9)
Total Bilirubin: 2.2 mg/dL — ABNORMAL HIGH (ref 0.3–1.2)
Total Protein: 6 g/dL — ABNORMAL LOW (ref 6.5–8.1)

## 2018-06-06 LAB — SURGICAL PCR SCREEN
MRSA, PCR: POSITIVE — AB
Staphylococcus aureus: POSITIVE — AB

## 2018-06-06 SURGERY — LAPAROSCOPIC CHOLECYSTECTOMY
Anesthesia: General | Site: Abdomen

## 2018-06-06 MED ORDER — PROPOFOL 10 MG/ML IV BOLUS
INTRAVENOUS | Status: AC
  Filled 2018-06-06: qty 20

## 2018-06-06 MED ORDER — CHLORHEXIDINE GLUCONATE CLOTH 2 % EX PADS
6.0000 | MEDICATED_PAD | Freq: Every day | CUTANEOUS | Status: DC
  Administered 2018-06-07 – 2018-06-08 (×2): 6 via TOPICAL

## 2018-06-06 MED ORDER — IPRATROPIUM-ALBUTEROL 0.5-2.5 (3) MG/3ML IN SOLN: 3.0000 mL | RESPIRATORY_TRACT | Status: DC

## 2018-06-06 MED ORDER — ROCURONIUM BROMIDE 50 MG/5ML IV SOLN
INTRAVENOUS | Status: AC
  Filled 2018-06-06: qty 1

## 2018-06-06 MED ORDER — ACETAMINOPHEN 10 MG/ML IV SOLN
INTRAVENOUS | Status: DC | PRN
  Administered 2018-06-06: 1000 mg via INTRAVENOUS

## 2018-06-06 MED ORDER — ACETAMINOPHEN 650 MG RE SUPP: 650.0000 mg | Freq: Four times a day (QID) | RECTAL | Status: DC | PRN

## 2018-06-06 MED ORDER — LACTATED RINGERS IV SOLN
INTRAVENOUS | Status: DC | PRN
  Administered 2018-06-06: 11:00:00 via INTRAVENOUS

## 2018-06-06 MED ORDER — FENTANYL CITRATE (PF) 100 MCG/2ML IJ SOLN: 25.0000 ug | INTRAMUSCULAR | Status: DC | PRN

## 2018-06-06 MED ORDER — ACETAMINOPHEN 10 MG/ML IV SOLN
INTRAVENOUS | Status: AC
  Filled 2018-06-06: qty 100

## 2018-06-06 MED ORDER — ONDANSETRON HCL 4 MG/2ML IJ SOLN
INTRAMUSCULAR | Status: DC | PRN
  Administered 2018-06-06: 4 mg via INTRAVENOUS

## 2018-06-06 MED ORDER — DEXAMETHASONE SODIUM PHOSPHATE 10 MG/ML IJ SOLN
INTRAMUSCULAR | Status: AC
  Filled 2018-06-06: qty 1

## 2018-06-06 MED ORDER — LIDOCAINE-EPINEPHRINE (PF) 1 %-1:200000 IJ SOLN
INTRAMUSCULAR | Status: DC | PRN
  Administered 2018-06-06: 16 mL via INTRAMUSCULAR

## 2018-06-06 MED ORDER — IPRATROPIUM-ALBUTEROL 0.5-2.5 (3) MG/3ML IN SOLN
3.0000 mL | Freq: Once | RESPIRATORY_TRACT | Status: AC
  Administered 2018-06-06: 3 mL via RESPIRATORY_TRACT

## 2018-06-06 MED ORDER — DEXAMETHASONE SODIUM PHOSPHATE 10 MG/ML IJ SOLN
INTRAMUSCULAR | Status: DC | PRN
  Administered 2018-06-06: 10 mg via INTRAVENOUS

## 2018-06-06 MED ORDER — ORAL CARE MOUTH RINSE: 15.0000 mL | Freq: Two times a day (BID) | OROMUCOSAL | Status: DC

## 2018-06-06 MED ORDER — LIDOCAINE HCL (CARDIAC) PF 100 MG/5ML IV SOSY
PREFILLED_SYRINGE | INTRAVENOUS | Status: DC | PRN
  Administered 2018-06-06: 60 mg via INTRAVENOUS

## 2018-06-06 MED ORDER — LIDOCAINE HCL (PF) 2 % IJ SOLN
INTRAMUSCULAR | Status: AC
  Filled 2018-06-06: qty 10

## 2018-06-06 MED ORDER — ENOXAPARIN SODIUM 40 MG/0.4ML ~~LOC~~ SOLN
40.0000 mg | SUBCUTANEOUS | Status: DC
  Administered 2018-06-07 – 2018-06-08 (×2): 40 mg via SUBCUTANEOUS
  Filled 2018-06-06 (×2): qty 0.4

## 2018-06-06 MED ORDER — SODIUM CHLORIDE 0.9 % IV BOLUS
500.0000 mL | Freq: Once | INTRAVENOUS | Status: AC
  Administered 2018-06-06: 17:00:00 500 mL via INTRAVENOUS

## 2018-06-06 MED ORDER — PROPOFOL 10 MG/ML IV BOLUS
INTRAVENOUS | Status: DC | PRN
  Administered 2018-06-06: 120 mg via INTRAVENOUS

## 2018-06-06 MED ORDER — SODIUM CHLORIDE (PF) 0.9 % IJ SOLN
INTRAMUSCULAR | Status: AC
  Filled 2018-06-06: qty 50

## 2018-06-06 MED ORDER — SUGAMMADEX SODIUM 200 MG/2ML IV SOLN
INTRAVENOUS | Status: DC | PRN
  Administered 2018-06-06: 150 mg via INTRAVENOUS

## 2018-06-06 MED ORDER — TRAMADOL HCL 50 MG PO TABS
50.0000 mg | ORAL_TABLET | Freq: Four times a day (QID) | ORAL | Status: DC | PRN
  Administered 2018-06-06: 18:00:00 50 mg via ORAL
  Filled 2018-06-06: qty 1

## 2018-06-06 MED ORDER — INDOCYANINE GREEN 25 MG IV SOLR
1.2500 mg | Freq: Once | INTRAVENOUS | Status: AC
  Administered 2018-06-06: 1.25 mg via INTRAVENOUS

## 2018-06-06 MED ORDER — BUPIVACAINE HCL (PF) 0.5 % IJ SOLN
INTRAMUSCULAR | Status: AC
  Filled 2018-06-06: qty 30

## 2018-06-06 MED ORDER — FENTANYL CITRATE (PF) 100 MCG/2ML IJ SOLN
INTRAMUSCULAR | Status: AC
  Filled 2018-06-06: qty 2

## 2018-06-06 MED ORDER — HYDROCODONE-ACETAMINOPHEN 5-325 MG PO TABS
1.0000 | ORAL_TABLET | ORAL | Status: DC | PRN
  Administered 2018-06-06 – 2018-06-08 (×8): 1 via ORAL
  Filled 2018-06-06 (×3): qty 1
  Filled 2018-06-06: qty 2
  Filled 2018-06-06 (×4): qty 1

## 2018-06-06 MED ORDER — FENTANYL CITRATE (PF) 100 MCG/2ML IJ SOLN
INTRAMUSCULAR | Status: DC | PRN
  Administered 2018-06-06 (×3): 50 ug via INTRAVENOUS

## 2018-06-06 MED ORDER — PHENYLEPHRINE HCL (PRESSORS) 10 MG/ML IV SOLN
INTRAVENOUS | Status: DC | PRN
  Administered 2018-06-06 (×2): 100 ug via INTRAVENOUS

## 2018-06-06 MED ORDER — ACETAMINOPHEN 325 MG PO TABS: 650.0000 mg | ORAL_TABLET | Freq: Four times a day (QID) | ORAL | Status: DC | PRN

## 2018-06-06 MED ORDER — IBUPROFEN 400 MG PO TABS: 600.0000 mg | ORAL_TABLET | Freq: Four times a day (QID) | ORAL | Status: DC | PRN

## 2018-06-06 MED ORDER — SUGAMMADEX SODIUM 200 MG/2ML IV SOLN
INTRAVENOUS | Status: AC
  Filled 2018-06-06: qty 2

## 2018-06-06 MED ORDER — IPRATROPIUM-ALBUTEROL 0.5-2.5 (3) MG/3ML IN SOLN
RESPIRATORY_TRACT | Status: AC
  Administered 2018-06-06: 14:00:00 3 mL via RESPIRATORY_TRACT
  Filled 2018-06-06: qty 3

## 2018-06-06 MED ORDER — SODIUM CHLORIDE 0.9 % IV BOLUS
500.0000 mL | Freq: Once | INTRAVENOUS | Status: AC
  Administered 2018-06-06: 15:00:00 500 mL via INTRAVENOUS

## 2018-06-06 MED ORDER — ONDANSETRON HCL 4 MG/2ML IJ SOLN: 4.0000 mg | Freq: Once | INTRAMUSCULAR | Status: DC | PRN

## 2018-06-06 MED ORDER — ORAL CARE MOUTH RINSE
15.0000 mL | Freq: Two times a day (BID) | OROMUCOSAL | Status: DC
  Administered 2018-06-06 – 2018-06-08 (×3): 15 mL via OROMUCOSAL

## 2018-06-06 MED ORDER — ONDANSETRON HCL 4 MG/2ML IJ SOLN
INTRAMUSCULAR | Status: AC
  Filled 2018-06-06: qty 2

## 2018-06-06 MED ORDER — ROCURONIUM BROMIDE 100 MG/10ML IV SOLN
INTRAVENOUS | Status: DC | PRN
  Administered 2018-06-06: 10 mg via INTRAVENOUS
  Administered 2018-06-06: 50 mg via INTRAVENOUS

## 2018-06-06 MED ORDER — MUPIROCIN 2 % EX OINT
1.0000 "application " | TOPICAL_OINTMENT | Freq: Two times a day (BID) | CUTANEOUS | Status: DC
  Administered 2018-06-06 – 2018-06-08 (×4): 1 via NASAL
  Filled 2018-06-06: qty 22

## 2018-06-06 SURGICAL SUPPLY — 57 items
ANCHOR TIS RET SYS 235ML (MISCELLANEOUS) ×1 IMPLANT
APPLIER CLIP 5 13 M/L LIGAMAX5 (MISCELLANEOUS) ×3
BLADE SURG SZ11 CARB STEEL (BLADE) ×5 IMPLANT
CANISTER SUCT 1200ML W/VALVE (MISCELLANEOUS) ×3 IMPLANT
CATH CHOLANG 76X19 KUMAR (CATHETERS) ×2 IMPLANT
CHLORAPREP W/TINT 26 (MISCELLANEOUS) ×3 IMPLANT
CHOLANGIOGRAM CATH TAUT (CATHETERS) IMPLANT
CLIP APPLIE 5 13 M/L LIGAMAX5 (MISCELLANEOUS) ×1 IMPLANT
COVER WAND RF STERILE (DRAPES) ×1 IMPLANT
DECANTER SPIKE VIAL GLASS SM (MISCELLANEOUS) ×6 IMPLANT
DEFOGGER SCOPE WARMER CLEARIFY (MISCELLANEOUS) ×2 IMPLANT
DERMABOND ADVANCED (GAUZE/BANDAGES/DRESSINGS) ×2
DERMABOND ADVANCED .7 DNX12 (GAUZE/BANDAGES/DRESSINGS) ×1 IMPLANT
DISSECTOR BLUNT TIP ENDO 5MM (MISCELLANEOUS) IMPLANT
DISSECTOR KITTNER STICK (MISCELLANEOUS) IMPLANT
DISSECTORS/KITTNER STICK (MISCELLANEOUS) ×3
DRAPE C-ARM XRAY 36X54 (DRAPES) IMPLANT
DRAPE SHEET LG 3/4 BI-LAMINATE (DRAPES) ×2 IMPLANT
ELECT CAUTERY BLADE 6.4 (BLADE) ×2 IMPLANT
ELECT REM PT RETURN 9FT ADLT (ELECTROSURGICAL) ×3
ELECTRODE REM PT RTRN 9FT ADLT (ELECTROSURGICAL) ×1 IMPLANT
GLOVE BIOGEL PI IND STRL 7.0 (GLOVE) ×1 IMPLANT
GLOVE BIOGEL PI INDICATOR 7.0 (GLOVE) ×6
GLOVE SURG SYN 6.5 ES PF (GLOVE) ×9 IMPLANT
GLOVE SURG SYN 6.5 PF PI (GLOVE) ×1 IMPLANT
GOWN STRL REUS W/ TWL LRG LVL3 (GOWN DISPOSABLE) ×3 IMPLANT
GOWN STRL REUS W/TWL LRG LVL3 (GOWN DISPOSABLE) ×6
GRASPER SUT TROCAR 14GX15 (MISCELLANEOUS) ×3 IMPLANT
IRRIGATION STRYKERFLOW (MISCELLANEOUS) IMPLANT
IRRIGATOR STRYKERFLOW (MISCELLANEOUS) ×3
IV CATH ANGIO 12GX3 LT BLUE (NEEDLE) IMPLANT
IV NS 1000ML (IV SOLUTION) ×2
IV NS 1000ML BAXH (IV SOLUTION) IMPLANT
JACKSON PRATT 10 (INSTRUMENTS) IMPLANT
L-HOOK LAP DISP 36CM (ELECTROSURGICAL) ×6
LHOOK LAP DISP 36CM (ELECTROSURGICAL) ×1 IMPLANT
NEEDLE HYPO 22GX1.5 SAFETY (NEEDLE) ×3 IMPLANT
NEEDLE VERESS 14GA 120MM (NEEDLE) ×2 IMPLANT
PACK LAP CHOLECYSTECTOMY (MISCELLANEOUS) ×3 IMPLANT
PENCIL ELECTRO HAND CTR (MISCELLANEOUS) ×5 IMPLANT
PORT ACCESS TROCAR AIRSEAL 5 (TROCAR) ×1 IMPLANT
SCISSORS METZENBAUM CVD 33 (INSTRUMENTS) ×3 IMPLANT
SET TRI-LUMEN FLTR TB AIRSEAL (TUBING) ×1 IMPLANT
SLEEVE ENDOPATH XCEL 5M (ENDOMECHANICALS) ×5 IMPLANT
SPONGE LAP 18X18 RF (DISPOSABLE) IMPLANT
STOPCOCK 4 WAY LG BORE MALE ST (IV SETS) IMPLANT
SUT MNCRL 4-0 (SUTURE) ×2
SUT MNCRL 4-0 27XMFL (SUTURE) ×1
SUT VIC AB 3-0 SH 27 (SUTURE)
SUT VIC AB 3-0 SH 27X BRD (SUTURE) IMPLANT
SUT VICRYL 0 AB UR-6 (SUTURE) ×6 IMPLANT
SUTURE MNCRL 4-0 27XMF (SUTURE) ×1 IMPLANT
SYR 20CC LL (SYRINGE) ×3 IMPLANT
TOWEL OR 17X26 4PK STRL BLUE (TOWEL DISPOSABLE) ×3 IMPLANT
TROCAR XCEL BLUNT TIP 100MML (ENDOMECHANICALS) ×3 IMPLANT
TROCAR XCEL NON-BLD 5MMX100MML (ENDOMECHANICALS) ×3 IMPLANT
WATER STERILE IRR 1000ML POUR (IV SOLUTION) ×3 IMPLANT

## 2018-06-06 NOTE — Anesthesia Postprocedure Evaluation (Signed)
Anesthesia Post Note  Patient: Penny Johnson  Procedure(s) Performed: LAPAROSCOPIC CHOLECYSTECTOMY (N/A Abdomen)  Patient location during evaluation: PACU Anesthesia Type: General Level of consciousness: awake and alert Pain management: pain level controlled Vital Signs Assessment: post-procedure vital signs reviewed and stable Respiratory status: spontaneous breathing, nonlabored ventilation, respiratory function stable and patient connected to nasal cannula oxygen Cardiovascular status: blood pressure returned to baseline and stable Postop Assessment: no apparent nausea or vomiting Anesthetic complications: no     Last Vitals:  Vitals:   06/06/18 1410 06/06/18 1436  BP: (!) 95/46 (!) 94/43  Pulse: 80 80  Resp: 18 18  Temp: 37.3 C 36.4 C  SpO2: 95% 94%    Last Pain:  Vitals:   06/06/18 1505  TempSrc:   PainSc: 9                  Lenard Simmer

## 2018-06-06 NOTE — Progress Notes (Addendum)
[facility] - [place] at [facility]   PATIENT NAME: Penny Johnson    MR#:  [id number]  DATE OF BIRTH:  06/05/1940  SUBJECTIVE:   Chief Complaint  Patient presents with  . Abdominal Pain   Patient is found laying in bed in NAD. Overall she feels her condition is gradually improving. Still feeling "quizzy in her stomach". Voices no new complaints. Felt her surgery went well. Feels tired and wants something to eat.  REVIEW OF SYSTEMS:  Review of Systems  Constitutional: Negative for chills, fever, malaise/fatigue and weight loss.  HENT: Negative for congestion, hearing loss and sore throat.   Eyes: Negative for blurred vision and double vision.  Respiratory: Negative for cough, shortness of breath and wheezing.   Cardiovascular: Negative for chest pain, palpitations, orthopnea and leg swelling.  Gastrointestinal: Positive for abdominal pain and nausea. Negative for diarrhea and vomiting.  Genitourinary: Negative for dysuria and urgency.  Musculoskeletal: Negative for myalgias.  Skin: Negative for rash.  Neurological: Negative for dizziness, sensory change, speech change, focal weakness and headaches.  Psychiatric/Behavioral: Negative for depression.   DRUG ALLERGIES:   Allergies  Allergen Reactions  . Theophylline Palpitations   VITALS:  Blood pressure (!) 100/48, pulse 94, temperature 99 F (37.2 C), temperature source Temporal, resp. rate 20, height 5\' 3"  (1.6 m), weight 69.3 kg, SpO2 97 %. PHYSICAL EXAMINATION:   GENERAL:  78 y.o.-year-old patient lying in the bed with no acute distress.  EYES: Pupils equal, round, reactive to light and accommodation. No scleral icterus. Extraocular muscles intact.  HEENT: Head normocephalic. Oropharynx and nasopharynx clear. + Swelling above left eyebrow. + Multiple facial bruises. NECK:  Supple, no jugular venous distention. No thyroid enlargement, no tenderness.  LUNGS: + Diminished breath sounds in the lung bases  bilaterally.  No wheezing, rales,rhonchi or crepitation. No use of accessory muscles of respiration.  CARDIOVASCULAR: RRR, S1, S2 normal. No murmurs, rubs, or gallops.  ABDOMEN: Soft, tenderness in RUQ epigastric region,  Slightly distended. Bowel sounds present. No organomegaly or mass.  EXTREMITIES: No cyanosis, or clubbing. 1+ edema to the knees bilaterally. NEUROLOGIC: Cranial nerves II through XII are intact. Muscle strength 5/5 in all extremities. Sensation intact. Gait not checked.  PSYCHIATRIC: The patient is alert and oriented x 1.  SKIN: No obvious rash, lesion, or ulcer. + Chronic venous stasis changes  LABORATORY PANEL:  Female CBC Recent Labs  Lab 06/06/18 0529  WBC 11.7*  HGB 10.2*  HCT 31.7*  PLT 178   ------------------------------------------------------------------------------------------------------------------ Chemistries  Recent Labs  Lab 06/06/18 0529  NA 136  K 3.7  CL 101  CO2 24  GLUCOSE 149*  BUN 16  CREATININE 1.24*  CALCIUM 8.1*  AST 311*  ALT 223*  ALKPHOS 133*  BILITOT 2.2*   RADIOLOGY:  Ct Abdomen Pelvis W Contrast  Result Date: 06/05/2018 CLINICAL DATA:  78 year old female with abdominal pain since midnight. Nausea. On home oxygen. EXAM: CT ABDOMEN AND PELVIS WITH CONTRAST TECHNIQUE: Multidetector CT imaging of the abdomen and pelvis was performed using the standard protocol following bolus administration of intravenous contrast. CONTRAST:  OMNIPAQUE IOHEXOL 300 MG/ML  SOLN COMPARISON:  Chest radiographs 03/04/2018 and earlier. FINDINGS: Lower chest: Centrilobular emphysema at the lung bases. No confluent lower lung opacity, pleural or pericardial effusion. Calcified granuloma in the left posterior costophrenic angle. Hepatobiliary: Multiple gallstones measuring up to 20 millimeters diameter. There are less calcified stones impacted within the common bile duct seen on coronal images 34 through 36.  The distal impacted stone is estimated at 16  millimeters diameter. The duct is dilated from 11-20 millimeters diameter. Superimposed generalized intrahepatic biliary ductal dilatation. No pericholecystic inflammatory stranding at this time. No discrete liver lesion. Pancreas: Main pancreatic duct remains within normal limits. No pancreatic inflammation identified. Spleen: Negative (small calcified granuloma). Adrenals/Urinary Tract: Negative adrenal glands. Bilateral renal enhancement and contrast excretion is symmetric and normal. Occasional renal cortical scarring. Decompressed proximal ureters. Unremarkable urinary bladder. Pelvic phleboliths. Stomach/Bowel: No dilated large or small bowel. Diverticulosis of the sigmoid colon and descending colon with no active inflammation. Redundant large bowel. The transverse colon is decompressed. Occasional transverse colon diverticula. Negative terminal ileum. Normal appendix on series 2, image 61. Mildly fluid distended stomach. Small gastric hiatal hernia. No free air, free fluid. Vascular/Lymphatic: Extensive Aortoiliac calcified atherosclerosis. Major arterial structures are patent. Portal venous system is patent. Reproductive: Negative. Other: No pelvic free fluid. Musculoskeletal: Degenerative changes in the spine. No acute osseous abnormality identified. IMPRESSION: 1. Positive for Acute Choledocholithiasis with multiple gallstones in the CBD. Dilated CBD up to 20 mm and intrahepatic ducts. Distal duct impacted stone estimated at 16 mm diameter. Superimposed cholelithiasis. Gastroenterology consultation recommended. 2. No pancreatic inflammation. No other acute process identified in the abdomen or pelvis. 3. Diverticulosis of the large bowel. Aortic Atherosclerosis (ICD10-I70.0) and Emphysema (ICD10-J43.9). Electronically Signed   By: Odessa Fleming M.D.   On: 06/05/2018 06:25   Dg C-arm 1-60 Min-no Report  Result Date: 06/05/2018 Fluoroscopy was utilized by the requesting physician.  No radiographic  interpretation.   ASSESSMENT AND PLAN:   78 y.o. female  COPD on home oxygen supplementation, history of coal dust exposure due to working at a [employer], nephrolithiasis, osteoporosis, hyperlipidemia, hypertension, and pneumonia presenting to the ED with chief complaints of right upper quadrant pain and acute respiratory insufficiency.  1. Acute Choledocholithiasis - CT abdomen and pelvis showing multiple gallstones in the CBD. Dilated CBD up to 20 mm and intrahepatic ducts. Distal duct impacted stone estimated at 16 mm diameter with superimposed cholelithiasis. -Status post ERCP  POD# 1 -No signs or symptoms of ascending cholangitis (Fever, obstructive jaundice, although with RUQ pain)  also has elevated LFTs andbilirubin. -GI /general surgery consult appreciated -Continue  IV Zosyn -Hydrate with IVFs -PRN analgesic  2.Acute on chronic COPD Exacerbation - patient with hx of COPD on home oxygen at 2L. Oxygenation improved. - Supplemental O2, goal sat 88-92% - Bronchodilators (albuterol/ipratropium) standing and PRN  - Corticosteroids: IV steroids 40 mg Q12 - No risk factors for Pseudomonas (recent hospitalization within 90 days, frequent antibiotics >4 courses/year, severe COPD, prior documented Pseudomonas) or persistent symptoms in spite of empiric antibiotics.) - Pulmonary rehabilitation referral at discharge if appropriate  3. Acute Kidney Injury - Likely prerenal in the setting of acute Choledocholithiasis -Hydrate with IVFs -Hold nephrotoxins -Continue to trend labs  4. Hypokalemia-Repleted -Recheck am Labs  5. Elevated liver function test-secondary to cholestasis as above, remains elevated -Continue to monitor and trend  6.Cholelithiasis with acute cholecystitis-status post Laparoscopic Cholecystectomy  POD # 0  7. DVT prophylaxis - SCDs   All the records are reviewed and case discussed with Care Management/Social Worker. Management plans discussed with  the patient, family and they are in agreement.  CODE STATUS: Full Code  TOTAL TIME TAKING CARE OF THIS PATIENT: 36 minutes.   More than 50% of the time was spent in counseling/coordination of care: YES  POSSIBLE D/C IN 1 DAYS, DEPENDING ON CLINICAL CONDITION.   on  06/06/2018 at 1:35 PM  This patient was staffed with Dr. Enid Baasjie, Jude who personally evaluated patient, reviewed documentation and agreed with assessment and plan of care as above.  Webb SilversmithElizabeth Jaleiah Asay, DNP, FNP-BC [facility] Nurse Practitioner   Between 7am to 6pm - Pager [phone]  After 6pm go to [email or web address] - Social research officer, governmentpassword EPAS [facility]  [facility] [place] [facility]  Office  [phone]  CC: Primary care physician; Department[facility]  Note: This dictation was prepared with Dragon dictation along with smaller phrase technology. Any transcriptional errors that result from this process are unintentional.

## 2018-06-06 NOTE — Plan of Care (Signed)
Pt s/p laparoscopic cholecystectomy. Hypotensive. 2x1/2L boluses given with improvement. Last BP 103/43. Dr Tonna Boehringer made aware. No new orders. Rest of VSS. Norco given for abd surgical pain with improvement. Denies n/v. Pt on clear liquid. Poor appetite. Abd incisions WDL. No drainage. Up to bsc with 1xassist. Encouraged pt to use incentive spirometer.

## 2018-06-06 NOTE — Progress Notes (Signed)
Pt consent remains to be obtained. Pt remains groggy at beginning of shift from sedation earlier in day and orientation currently varies when awakened from sleep.

## 2018-06-06 NOTE — Transfer of Care (Signed)
Immediate Anesthesia Transfer of Care Note  Patient: Penny Johnson  Procedure(s) Performed: Procedure(s): LAPAROSCOPIC CHOLECYSTECTOMY (N/A)  Patient Location: PACU  Anesthesia Type:General  Level of Consciousness: sedated  Airway & Oxygen Therapy: Patient Spontanous Breathing and Patient connected to face mask oxygen  Post-op Assessment: Report given to RN and Post -op Vital signs reviewed and stable  Post vital signs: Reviewed and stable  Last Vitals:  Vitals:   06/06/18 0807 06/06/18 1325  BP: (!) 96/43 (!) 100/48  Pulse: 81 94  Resp: 18   Temp: 36.5 C 37.2 C  SpO2: 95% 97%    Complications: No apparent anesthesia complications

## 2018-06-06 NOTE — Anesthesia Post-op Follow-up Note (Signed)
Anesthesia QCDR form completed.        

## 2018-06-06 NOTE — Anesthesia Preprocedure Evaluation (Signed)
Anesthesia Evaluation  Patient identified by MRN, date of birth, ID band Patient awake    Reviewed: Allergy & Precautions, NPO status , Patient's Chart, lab work & pertinent test results  History of Anesthesia Complications Negative for: history of anesthetic complications  Airway Mallampati: II  TM Distance: >3 FB Neck ROM: Full    Dental  (+) Edentulous Upper, Edentulous Lower   Pulmonary neg sleep apnea, COPD (wears 2L continuously),  oxygen dependent, former smoker,    breath sounds clear to auscultation- rhonchi (-) wheezing      Cardiovascular hypertension, Pt. on medications (-) CAD, (-) Past MI, (-) Cardiac Stents and (-) CABG  Rhythm:Regular Rate:Normal - Systolic murmurs and - Diastolic murmurs    Neuro/Psych neg Seizures negative neurological ROS  negative psych ROS   GI/Hepatic negative GI ROS, Neg liver ROS,   Endo/Other  negative endocrine ROS  Renal/GU Renal InsufficiencyRenal disease     Musculoskeletal negative musculoskeletal ROS (+)   Abdominal (+) - obese,   Peds  Hematology negative hematology ROS (+)   Anesthesia Other Findings Past Medical History: No date: COPD (chronic obstructive pulmonary disease) (HCC) No date: Hyperlipidemia No date: Hypertension No date: Nephrolithiasis No date: Pneumonia   Reproductive/Obstetrics                             Anesthesia Physical Anesthesia Plan  ASA: III  Anesthesia Plan: General   Post-op Pain Management:    Induction: Intravenous  PONV Risk Score and Plan: 2 and Ondansetron and Dexamethasone  Airway Management Planned: Oral ETT  Additional Equipment:   Intra-op Plan:   Post-operative Plan: Extubation in OR and Possible Post-op intubation/ventilation  Informed Consent: I have reviewed the patients History and Physical, chart, labs and discussed the procedure including the risks, benefits and alternatives for  the proposed anesthesia with the patient or authorized representative who has indicated his/her understanding and acceptance.     Dental advisory given  Plan Discussed with: CRNA and Anesthesiologist  Anesthesia Plan Comments: (Discussed possible need for postop intubation and ventilation due to hx of severe COPD and chronic O2 use)        Anesthesia Quick Evaluation

## 2018-06-06 NOTE — Anesthesia Procedure Notes (Signed)
Procedure Name: Intubation Date/Time: 06/06/2018 10:50 AM Performed by: Aline Brochure, CRNA Pre-anesthesia Checklist: Patient identified, Emergency Drugs available, Suction available and Patient being monitored Patient Re-evaluated:Patient Re-evaluated prior to induction Oxygen Delivery Method: Circle system utilized Preoxygenation: Pre-oxygenation with 100% oxygen Induction Type: IV induction Ventilation: Mask ventilation without difficulty Laryngoscope Size: Mac and 3 Grade View: Grade I Tube type: Oral Tube size: 7.0 mm Number of attempts: 1 Airway Equipment and Method: Stylet Placement Confirmation: ETT inserted through vocal cords under direct vision,  positive ETCO2 and breath sounds checked- equal and bilateral Secured at: 21 cm Tube secured with: Tape Dental Injury: Teeth and Oropharynx as per pre-operative assessment

## 2018-06-06 NOTE — Progress Notes (Signed)
Notified Jamie, OR RN of pt being positive for MRSA/Staph PCR.

## 2018-06-06 NOTE — Op Note (Signed)
Preoperative diagnosis:  cholelithiasis  Postoperative diagnosis: same as above with acute cholecystitis  Procedure: Laparoscopic Cholecystectomy.   Anesthesia: GETA   Surgeon: Sung Amabile  Specimen: Gallbladder  Complications: None  EBL: 46mL  Wound Classification: Clean Contaminated  Indications: see HPI  Findings: Critical view of safety noted Cystic duct and artery identified, ligated and divided, clips remained intact at end of procedure Adequate hemostasis  Description of procedure: The patient was placed on the operating table in the supine position. SCDs placed, pre-op abx administered.  General anesthesia was induced and OG tube placed by anesthesia. A time-out was completed verifying correct patient, procedure, site, positioning, and implant(s) and/or special equipment prior to beginning this procedure. The abdomen was prepped and draped in the usual sterile fashion.  An incision was made in a natural skin line under the umbilicus.  Dissection carried down to fascia where veress needle placed and insufflation started up to 51mm Hg without any dramatic increase in pressure after two click and saline drop test. 63mm optiview technique used to gain entry after removing veress needle.  No injuries from initial trocar placement were noted. Additional trocars were then inserted under direct visualization in the following locations: a 12-mm trocar in the subxyphoid region and two 5-mm trocars along the right costal margin. An additional port was placed in the RUQ after noting a very large liver requiring extra retraction.  The abdomen was inspected and no abnormalities or injuries were found. The table was placed in the reverse Trendelenburg position with the right side up.  Filmy adhesions between the gallbladder and omentum, duodenum and transverse colon were lysed sharply. The dome of the gallbladder was grasped with an atraumatic grasper passed through the lateral port and  retracted over the dome of the liver. The infundibulum was also grasped with an atraumatic grasper and retracted toward the right lower quadrant. This maneuver exposed Calot's triangle. The peritoneum overlying the gallbladder infundibulum was then dissected and the cystic duct and cystic artery identified.  Duct confirmed via using ICG.  Critical view of safety with the liver bed clearly visible behind the duct and artery with no additional structures noted.  Picture taken before the cystic duct and cystic artery clipped and divided close to the gallbladder.  The gallbladder was then dissected from its peritoneal and liver bed attachments by electrocautery. Hemostasis was checked and the gallbladder was removed using an endoscopic retrieval bag placed through the xyphoid port. The gallbladder was passed off the table as a specimen. The gallbladder fossa was copiously irrigated with saline and any leaked bile was suctioned out, and hemostasis was obtained. There was no evidence of bleeding from the gallbladder fossa or cystic artery or leakage of the bile from the cystic duct stump. Abdomen desufflated and trocars were removed under direct vision. No bleeding was noted.  Figure eight 0 vicryl used to close xyphoid fascia before using 3-0 vicryl to close deep dermal layer at xyphoid site.  All skin incisions then closed with subcuticular sutures of 4-0 monocryl and dressed with topical skin adhesive. The orogastric tube was removed and patient extubated. The patient tolerated the procedure well and was taken to the postanesthesia care unit in stable condition.  All sponge and instrument count correct at end of procedure.

## 2018-06-07 ENCOUNTER — Encounter: Payer: Self-pay | Admitting: Surgery

## 2018-06-07 LAB — CBC WITH DIFFERENTIAL/PLATELET
Abs Immature Granulocytes: 0.12 10*3/uL — ABNORMAL HIGH (ref 0.00–0.07)
Basophils Absolute: 0 10*3/uL (ref 0.0–0.1)
Basophils Relative: 0 %
Eosinophils Absolute: 0 10*3/uL (ref 0.0–0.5)
Eosinophils Relative: 0 %
HCT: 30.1 % — ABNORMAL LOW (ref 36.0–46.0)
Hemoglobin: 9.7 g/dL — ABNORMAL LOW (ref 12.0–15.0)
Immature Granulocytes: 1 %
Lymphocytes Relative: 3 %
Lymphs Abs: 0.4 10*3/uL — ABNORMAL LOW (ref 0.7–4.0)
MCH: 29.4 pg (ref 26.0–34.0)
MCHC: 32.2 g/dL (ref 30.0–36.0)
MCV: 91.2 fL (ref 80.0–100.0)
Monocytes Absolute: 0.7 10*3/uL (ref 0.1–1.0)
Monocytes Relative: 6 %
Neutro Abs: 11.2 10*3/uL — ABNORMAL HIGH (ref 1.7–7.7)
Neutrophils Relative %: 90 %
Platelets: 191 10*3/uL (ref 150–400)
RBC: 3.3 MIL/uL — ABNORMAL LOW (ref 3.87–5.11)
RDW: 13.7 % (ref 11.5–15.5)
WBC: 12.4 10*3/uL — ABNORMAL HIGH (ref 4.0–10.5)
nRBC: 0 % (ref 0.0–0.2)

## 2018-06-07 LAB — COMPREHENSIVE METABOLIC PANEL
ALT: 158 U/L — ABNORMAL HIGH (ref 0–44)
AST: 141 U/L — ABNORMAL HIGH (ref 15–41)
Albumin: 3 g/dL — ABNORMAL LOW (ref 3.5–5.0)
Alkaline Phosphatase: 96 U/L (ref 38–126)
Anion gap: 7 (ref 5–15)
BUN: 17 mg/dL (ref 8–23)
CO2: 26 mmol/L (ref 22–32)
Calcium: 7.9 mg/dL — ABNORMAL LOW (ref 8.9–10.3)
Chloride: 105 mmol/L (ref 98–111)
Creatinine, Ser: 1.13 mg/dL — ABNORMAL HIGH (ref 0.44–1.00)
GFR calc Af Amer: 55 mL/min — ABNORMAL LOW (ref >60)
GFR calc non Af Amer: 47 mL/min — ABNORMAL LOW (ref >60)
Glucose, Bld: 127 mg/dL — ABNORMAL HIGH (ref 70–99)
Potassium: 3.7 mmol/L (ref 3.5–5.1)
Sodium: 138 mmol/L (ref 135–145)
Total Bilirubin: 0.8 mg/dL (ref 0.3–1.2)
Total Protein: 5.8 g/dL — ABNORMAL LOW (ref 6.5–8.1)

## 2018-06-07 MED ORDER — SODIUM CHLORIDE 0.9% FLUSH
3.0000 mL | Freq: Two times a day (BID) | INTRAVENOUS | Status: DC
  Administered 2018-06-07 – 2018-06-08 (×4): 3 mL via INTRAVENOUS

## 2018-06-07 MED ORDER — MENTHOL 3 MG MT LOZG
1.0000 | LOZENGE | OROMUCOSAL | Status: DC | PRN
  Filled 2018-06-07: qty 9

## 2018-06-07 MED ORDER — MECLIZINE HCL 25 MG PO TABS
25.0000 mg | ORAL_TABLET | Freq: Three times a day (TID) | ORAL | Status: DC | PRN
  Filled 2018-06-07: qty 1

## 2018-06-07 MED ORDER — CEPASTAT 14.5 MG MT LOZG: 1.0000 | LOZENGE | OROMUCOSAL | Status: DC | PRN

## 2018-06-07 NOTE — Progress Notes (Signed)
Subjective:  CC: Penny Johnson is a 78 y.o. female  Hospital stay day 2, 1 Day Post-Op lap chol  HPI: Several records of hypotension that resolved with 1L bolus.  Tolerated some jello, complaining of some soreness in RUQ.  Also complaining of some tremors.  ROS:  General: Denies weight loss, weight gain, fatigue, fevers, chills, and night sweats. Heart: Denies chest pain, palpitations, racing heart, irregular heartbeat, leg pain or swelling, and decreased activity tolerance. Respiratory: Denies breathing difficulty, shortness of breath, wheezing, cough, and sputum. GI: Denies change in appetite, heartburn, nausea, vomiting, constipation, diarrhea, and blood in stool. GU: Denies difficulty urinating, pain with urinating, urgency, frequency, blood in urine.   Objective:   Temp:  [97.6 F (36.4 C)-99.1 F (37.3 C)] 97.8 F (36.6 C) (04/25 0739) Pulse Rate:  [80-94] 80 (04/25 0739) Resp:  [18-23] 20 (04/25 0739) BP: (87-103)/(40-50) 99/50 (04/25 0739) SpO2:  [94 %-97 %] 95 % (04/25 0749)     Height: 5\' 3"  (160 cm) Weight: 69.3 kg BMI (Calculated): 27.07   Intake/Output this shift:   Intake/Output Summary (Last 24 hours) at 06/07/2018 0835 Last data filed at 06/07/2018 0455 Gross per 24 hour  Intake 1611.54 ml  Output 700 ml  Net 911.54 ml    Constitutional :  alert, cooperative, appears stated age and no distress  Gastrointestinal: soft, appropriate tenderness around incision sites that are c/d/i.Marland Kitchen   Skin: Cool and moist.   Psychiatric: Normal affect, non-agitated, not confused       LABS:  CMP Latest Ref Rng & Units 06/07/2018 06/06/2018 06/05/2018  Glucose 70 - 99 mg/dL 379(K) 240(X) 735(H)  BUN 8 - 23 mg/dL 17 16 12   Creatinine 0.44 - 1.00 mg/dL 2.99(M) 4.26(S) 3.41(D)  Sodium 135 - 145 mmol/L 138 136 134(L)  Potassium 3.5 - 5.1 mmol/L 3.7 3.7 3.2(L)  Chloride 98 - 111 mmol/L 105 101 96(L)  CO2 22 - 32 mmol/L 26 24 27   Calcium 8.9 - 10.3 mg/dL 7.9(L) 8.1(L) 9.0  Total  Protein 6.5 - 8.1 g/dL 6.2(I) 6.0(L) 7.1  Total Bilirubin 0.3 - 1.2 mg/dL 0.8 2.2(H) 1.9(H)  Alkaline Phos 38 - 126 U/L 96 133(H) 170(H)  AST 15 - 41 U/L 141(H) 311(H) 431(H)  ALT 0 - 44 U/L 158(H) 223(H) 131(H)   CBC Latest Ref Rng & Units 06/07/2018 06/06/2018 06/05/2018  WBC 4.0 - 10.5 K/uL 12.4(H) 11.7(H) 12.2(H)  Hemoglobin 12.0 - 15.0 g/dL 2.9(N) 10.2(L) 12.0  Hematocrit 36.0 - 46.0 % 30.1(L) 31.7(L) 36.4  Platelets 150 - 400 K/uL 191 178 243    RADS: n/a Assessment:   S/p lap chole.  Recovering as expected.  Bili wnl, ok to d/c from surgery standpoint once tolerating regular diet.  F/u two weeks in office

## 2018-06-07 NOTE — Progress Notes (Signed)
[employer] - [place] at [facility]   PATIENT NAME: Penny Johnson    MR#:  [id number]  DATE OF BIRTH:  November 23, 1940  SUBJECTIVE:   Chief Complaint  Patient presents with   Abdominal Pain   Patient is found laying in bed in NAD. Overall she feels her condition is gradually improving. Still feeling generalized weak. Voices no new complaints. Felt her surgery went well.  Had shortness of breath after walking up to the door of her room.  REVIEW OF SYSTEMS:  Review of Systems  Constitutional: Negative for chills, fever, malaise/fatigue and weight loss.  HENT: Negative for congestion, hearing loss and sore throat.   Eyes: Negative for blurred vision and double vision.  Respiratory: Negative for cough, shortness of breath and wheezing.   Cardiovascular: Negative for chest pain, palpitations, orthopnea and leg swelling.  Gastrointestinal: Negative for abdominal pain, diarrhea, nausea and vomiting.  Genitourinary: Negative for dysuria and urgency.  Musculoskeletal: Negative for myalgias.  Skin: Negative for rash.  Neurological: Negative for dizziness, sensory change, speech change, focal weakness and headaches.  Psychiatric/Behavioral: Negative for depression.   DRUG ALLERGIES:   Allergies  Allergen Reactions   Theophylline Palpitations   VITALS:  Blood pressure (!) 92/40, pulse 96, temperature 98.2 F (36.8 C), temperature source Oral, resp. rate 20, height 5\' 3"  (1.6 m), weight 69.3 kg, SpO2 94 %. PHYSICAL EXAMINATION:   GENERAL:  78 y.o.-year-old patient lying in the bed with no acute distress.  EYES: Pupils equal, round, reactive to light and accommodation. No scleral icterus. Extraocular muscles intact.  HEENT: Head normocephalic. Oropharynx and nasopharynx clear. + Swelling above left eyebrow. + Multiple facial bruises. NECK:  Supple, no jugular venous distention. No thyroid enlargement, no tenderness.  LUNGS: Normal breath sounds in the lung bases  bilaterally.  Some wheezing, no crepitation. No use of accessory muscles of respiration.  CARDIOVASCULAR: RRR, S1, S2 normal. No murmurs, rubs, or gallops.  ABDOMEN: Soft, tenderness in RUQ epigastric region,  Slightly distended. Bowel sounds present. No organomegaly or mass.  EXTREMITIES: No cyanosis, or clubbing. 1+ edema to the knees bilaterally. NEUROLOGIC: Cranial nerves II through XII are intact. Muscle strength 5/5 in all extremities. Sensation intact. Gait not checked.  PSYCHIATRIC: The patient is alert and oriented x 3.  SKIN: No obvious rash, lesion, or ulcer. + Chronic venous stasis changes  LABORATORY PANEL:  Female CBC Recent Labs  Lab 06/07/18 0426  WBC 12.4*  HGB 9.7*  HCT 30.1*  PLT 191   ------------------------------------------------------------------------------------------------------------------ Chemistries  Recent Labs  Lab 06/07/18 0426  NA 138  K 3.7  CL 105  CO2 26  GLUCOSE 127*  BUN 17  CREATININE 1.13*  CALCIUM 7.9*  AST 141*  ALT 158*  ALKPHOS 96  BILITOT 0.8   RADIOLOGY:  Dg C-arm 1-60 Min-no Report  Result Date: 06/05/2018 Fluoroscopy was utilized by the requesting physician.  No radiographic interpretation.   ASSESSMENT AND PLAN:   78 y.o. female  COPD on home oxygen supplementation, history of coal dust exposure due to working at a [employer], nephrolithiasis, osteoporosis, hyperlipidemia, hypertension, and pneumonia presenting to the ED with chief complaints of right upper quadrant pain and acute respiratory insufficiency.  1. Acute Choledocholithiasis - CT abdomen and pelvis showing multiple gallstones in the CBD. Dilated CBD up to 20 mm and intrahepatic ducts. Distal duct impacted stone estimated at 16 mm diameter with superimposed cholelithiasis. -Status post ERCP  -No signs or symptoms of ascending cholangitis (Fever, obstructive jaundice,  although with RUQ pain)  also has elevated LFTs andbilirubin. -GI /general surgery  consult appreciated-status post laparoscopic cholecystectomy 06/06/18 -Hydrate with IVFs -PRN analgesic -Tolerating regular diet.  2.Acute on chronic COPD Exacerbation - patient with hx of COPD on home oxygen at 2L. Oxygenation improved. - Supplemental O2, goal sat 88-92% - Bronchodilators (albuterol/ipratropium) standing and PRN  - Corticosteroids: IV steroids 40 mg Q12 - No risk factors for Pseudomonas (recent hospitalization within 90 days, frequent antibiotics >4 courses/year, severe COPD, prior documented Pseudomonas) or persistent symptoms in spite of empiric antibiotics.) - She may need nebulizer treatments prescribed at home.  3. Acute Kidney Injury - Likely prerenal in the setting of acute Choledocholithiasis -Hydrate with IVFs -Hold nephrotoxins -Continue to trend labs  4. Hypokalemia-Repleted -Recheck am Labs.  5. Elevated liver function test-secondary to cholestasis as above, remains elevated -Continue to monitor and trend.  6.Cholelithiasis with acute cholecystitis-status post Laparoscopic Cholecystectomy  POD # 1  7. DVT prophylaxis - SCDs   All the records are reviewed and case discussed with Care Management/Social Worker. Management plans discussed with the patient, family and they are in agreement.  CODE STATUS: Full Code  TOTAL TIME TAKING CARE OF THIS PATIENT: 36 minutes.   More than 50% of the time was spent in counseling/coordination of care: YES  POSSIBLE D/C IN 1 DAYS, DEPENDING ON CLINICAL CONDITION.   on 06/07/2018 at 4:14 PM  Between 7am to 6pm - Pager - [phone]  After 6pm go to [email or web address] - Social research officer, governmentpassword [email or web address]  [facility] [place] [facility]  Office  [phone]  CC: Primary care physician; Department, [facility]  Note: This dictation was prepared with Dragon dictation along with smaller phrase technology. Any transcriptional errors that result from this process are unintentional.

## 2018-06-07 NOTE — Progress Notes (Signed)
Reports she is starting to have pain in her neck from lying in bed and her op site. Pt refused to get OOB/chair. Encouraged to move/ambulate/sit in chair. Norco given; warm moist washcloth in plastic bag applied to neck.

## 2018-06-07 NOTE — Progress Notes (Addendum)
Pt reports "sore" in op sites. Pain controlled with norco after walking/sitting in chair. Reports feeling generally weak with diet advanced to regular with fair appetite but tolerated. Ambulated out of room into hall with 02 on at 2l/[place] which is chronic use by pt with pt becoming very dysneic upon minimal exertion. Pt sat up in chair for over an hours/assisted back to bed. Has very diminished breath sounds/wheezes; pt reports she has COPD and this limits her ability to be active normally. Pt currently sleeping.  Pt verbalized due to weakness and dysnea that she does not feel that she is ready to go home.

## 2018-06-07 NOTE — Discharge Instructions (Signed)
Laparoscopic Cholecystectomy, Care After This sheet gives you information about how to care for yourself after your procedure. Your doctor may also give you more specific instructions. If you have problems or questions, contact your doctor. Follow these instructions at home: Care for cuts from surgery (incisions)   Follow instructions from your doctor about how to take care of your cuts from surgery. Make sure you: ? Wash your hands with soap and water before you change your bandage (dressing). If you cannot use soap and water, use hand sanitizer. ? Change your bandage as told by your doctor. ? Leave stitches (sutures), skin glue, or skin tape (adhesive) strips in place. They may need to stay in place for 2 weeks or longer. If tape strips get loose and curl up, you may trim the loose edges. Do not remove tape strips completely unless your doctor says it is okay.  Do not take baths, swim, or use a hot tub until your doctor says it is okay. OK TO SHOWER 24HRS AFTER YOUR SURGERY.   Check your surgical cut area every day for signs of infection. Check for: ? More redness, swelling, or pain. ? More fluid or blood. ? Warmth. ? Pus or a bad smell. Activity  Do not drive or use heavy machinery while taking prescription pain medicine.  Do not play contact sports until your doctor says it is okay.  Do not drive for 24 hours if you were given a medicine to help you relax (sedative).  Rest as needed. Do not return to work or school until your doctor says it is okay. General instructions   tylenol and advil as needed for discomfort.  Please alternate between the two every four hours as needed for pain.     Use narcotics, if prescribed, only when tylenol and motrin is not enough to control pain.   325-650mg  every 8hrs to max of 4000mg /24hrs (including the 325mg  in every norco dose) for the tylenol.     Advil up to 800mg  per dose every 8hrs as needed for pain.    To prevent or treat constipation  while you are taking prescription pain medicine, your doctor may recommend that you: ? Drink enough fluid to keep your pee (urine) clear or pale yellow. ? Take over-the-counter or prescription medicines. ? Eat foods that are high in fiber, such as fresh fruits and vegetables, whole grains, and beans. ? Limit foods that are high in fat and processed sugars, such as fried and sweet foods. Contact a doctor if:  You develop a rash.  You have more redness, swelling, or pain around your surgical cuts.  You have more fluid or blood coming from your surgical cuts.  Your surgical cuts feel warm to the touch.  You have pus or a bad smell coming from your surgical cuts.  You have a fever.  One or more of your surgical cuts breaks open. Get help right away if:  You have trouble breathing.  You have chest pain.  You have pain that is getting worse in your shoulders.  You faint or feel dizzy when you stand.  You have very bad pain in your belly (abdomen).  You are sick to your stomach (nauseous) for more than one day.  You have throwing up (vomiting) that lasts for more than one day.  You have leg pain. This information is not intended to replace advice given to you by your health care provider. Make sure you discuss any questions you have with your  health care provider. Document Released: 11/08/2007 Document Revised: 08/20/2015 Document Reviewed: 07/18/2015 Elsevier Interactive Patient Education  2019 [employer].

## 2018-06-08 LAB — BASIC METABOLIC PANEL
Anion gap: 6 (ref 5–15)
BUN: 18 mg/dL (ref 8–23)
CO2: 26 mmol/L (ref 22–32)
Calcium: 8.4 mg/dL — ABNORMAL LOW (ref 8.9–10.3)
Chloride: 104 mmol/L (ref 98–111)
Creatinine, Ser: 1.04 mg/dL — ABNORMAL HIGH (ref 0.44–1.00)
GFR calc Af Amer: >60 mL/min (ref >60)
GFR calc non Af Amer: 52 mL/min — ABNORMAL LOW (ref >60)
Glucose, Bld: 116 mg/dL — ABNORMAL HIGH (ref 70–99)
Potassium: 3.5 mmol/L (ref 3.5–5.1)
Sodium: 136 mmol/L (ref 135–145)

## 2018-06-08 LAB — CBC WITH DIFFERENTIAL/PLATELET
Abs Immature Granulocytes: 0.12 10*3/uL — ABNORMAL HIGH (ref 0.00–0.07)
Basophils Absolute: 0 10*3/uL (ref 0.0–0.1)
Basophils Relative: 0 %
Eosinophils Absolute: 0 10*3/uL (ref 0.0–0.5)
Eosinophils Relative: 0 %
HCT: 30.8 % — ABNORMAL LOW (ref 36.0–46.0)
Hemoglobin: 9.7 g/dL — ABNORMAL LOW (ref 12.0–15.0)
Immature Granulocytes: 1 %
Lymphocytes Relative: 5 %
Lymphs Abs: 0.5 10*3/uL — ABNORMAL LOW (ref 0.7–4.0)
MCH: 29.2 pg (ref 26.0–34.0)
MCHC: 31.5 g/dL (ref 30.0–36.0)
MCV: 92.8 fL (ref 80.0–100.0)
Monocytes Absolute: 0.7 10*3/uL (ref 0.1–1.0)
Monocytes Relative: 7 %
Neutro Abs: 8.3 10*3/uL — ABNORMAL HIGH (ref 1.7–7.7)
Neutrophils Relative %: 87 %
Platelets: 190 10*3/uL (ref 150–400)
RBC: 3.32 MIL/uL — ABNORMAL LOW (ref 3.87–5.11)
RDW: 13.8 % (ref 11.5–15.5)
WBC: 9.6 10*3/uL (ref 4.0–10.5)
nRBC: 0 % (ref 0.0–0.2)

## 2018-06-08 LAB — HEPATIC FUNCTION PANEL
ALT: 106 U/L — ABNORMAL HIGH (ref 0–44)
AST: 53 U/L — ABNORMAL HIGH (ref 15–41)
Albumin: 3 g/dL — ABNORMAL LOW (ref 3.5–5.0)
Alkaline Phosphatase: 84 U/L (ref 38–126)
Bilirubin, Direct: 0.2 mg/dL (ref 0.0–0.2)
Indirect Bilirubin: 0.4 mg/dL (ref 0.3–0.9)
Total Bilirubin: 0.6 mg/dL (ref 0.3–1.2)
Total Protein: 5.8 g/dL — ABNORMAL LOW (ref 6.5–8.1)

## 2018-06-08 MED ORDER — PREDNISONE 10 MG (21) PO TBPK: ORAL_TABLET | ORAL | 0 refills | Status: DC

## 2018-06-08 MED ORDER — HYDROCODONE-ACETAMINOPHEN 5-325 MG PO TABS: 1.0000 | ORAL_TABLET | Freq: Four times a day (QID) | ORAL | 0 refills | Status: DC | PRN

## 2018-06-08 NOTE — Progress Notes (Signed)
Norco effective for post op pain. Up to chair and ambulated around the FOB to chair with 02@ 2l/[place] and tolerated well. Pt reluctant to ambulate more due to COPD/DOE. Tolerated diet well. Afebrile. Opsites healing well. No issues. Pt states she is ready to be discharge home to self/family care. Oral and written AVS instructions given with printed norco rx placed in packet. Pt calling dgt and will remind her to bring her home 02 with her for transport home.

## 2018-06-08 NOTE — Plan of Care (Signed)

## 2018-06-08 NOTE — Progress Notes (Signed)
Pt transported in transport chair to private vehicle and released to family with 02 @ 2l/[place] in car. Discharge home to self/family care.

## 2018-06-08 NOTE — Discharge Summary (Signed)
[place] at [place] NAME: Penny Johnson    MR#:  [id number]  DATE OF BIRTH:  [birth year]  DATE OF ADMISSION:  06/05/2018 ADMITTING PHYSICIAN: Otila Back, MD  DATE OF DISCHARGE: 06/08/2018  PRIMARY CARE PHYSICIAN: Department, [facility]    ADMISSION DIAGNOSIS:  [facility]  DISCHARGE DIAGNOSIS:  Active Problems:   Choledocholithiasis   Abnormal liver CT   COPD exacerbation.  SECONDARY DIAGNOSIS:   Past Medical History:  Diagnosis Date  . COPD (chronic obstructive pulmonary disease) ([place])   . Hyperlipidemia   . Hypertension   . Nephrolithiasis   . Pneumonia     HOSPITAL COURSE:   78 y.o. female  COPD onhomeoxygen supplementation, history of coal dust exposure due to working at a [employer], nephrolithiasis, osteoporosis, hyperlipidemia, hypertension, and pneumonia presenting to the ED with chief complaints of right upper quadrant painand acute respiratory insufficiency.  1.Acute Choledocholithiasis- CT abdomen and pelvis showingmultiple gallstones in the CBD. Dilated CBD up to 20 mm and intrahepatic ducts. Distal duct impacted stone estimated at 16 mm diameterwith superimposed cholelithiasis. -Status post ERCP  -No signs or symptoms of ascending cholangitis(Fever, obstructive jaundice,althoughwith RUQ pain) also has elevated LFTs andbilirubin. -GI /general surgery consult appreciated-status post laparoscopic cholecystectomy 06/06/18 -Hydrate with IVFs -PRN analgesic -Tolerating regular diet. No much pain.  2.Acuteon chronicCOPD Exacerbation- patient with hx of COPD on home oxygen at 2L. Oxygenation improved. - Supplemental O2, goal sat 88-92% - Bronchodilators (albuterol/ipratropium) standing and PRN  - Corticosteroids:IV steroids 40 mg Q12 - [place] factors for Pseudomonas (recent hospitalization within 90 days, frequent antibiotics >4 courses/year, severe COPD,  priordocumentedPseudomonas) or persistent symptoms in spite of empiric antibiotics.) - She was able to ambulate fine in the room.  She has nebulizer meds and machine at home.  Given tapering prescription on prednisone.  3. Acute Kidney Injury - Likelyprerenal in the setting of acuteCholedocholithiasis -Hydrate withIVFs -Hold nephrotoxins -Continue to trend labs  4. Hypokalemia-Repleted -Recheck am Labs.  5. Elevated liver function test-secondary to cholestasis as above, remains elevated -Continue to monitor and trend.  6.Cholelithiasis with acute cholecystitis-status post Laparoscopic Cholecystectomy  , follow in surgical clinic in 2 weeks.  7. DVTprophylaxis- SCDs   DISCHARGE CONDITIONS:   Stable.  CONSULTS OBTAINED:  Treatment Team:  Lucilla Lame, MD Benjamine Sprague, DO Virgel Manifold, MD  DRUG ALLERGIES:   Allergies  Allergen Reactions  . Theophylline Palpitations    DISCHARGE MEDICATIONS:   Allergies as of 06/08/2018      Reactions   Theophylline Palpitations      Medication List    TAKE these medications   acetaminophen 325 MG tablet Commonly known as:  TYLENOL Take 650 mg by mouth every 4 (four) hours as needed for pain.   albuterol 108 (90 Base) MCG/ACT inhaler Commonly known as:  VENTOLIN HFA Inhale 2 puffs into the lungs every 4 (four) hours as needed for wheezing.   albuterol (2.5 MG/3ML) 0.083% nebulizer solution Commonly known as:  PROVENTIL Inhale 3 mLs into the lungs 4 (four) times daily.   alendronate 70 MG tablet Commonly known as:  FOSAMAX Take 70 mg by mouth once a week.   azelastine 0.1 % nasal spray Commonly known as:  ASTELIN Place 1-2 sprays into both nostrils 2 (two) times daily.   citalopram 40 MG tablet Commonly known as:  CELEXA Take 20 mg by mouth 2 (two) times daily.   fexofenadine 60 MG tablet Commonly known as:  ALLEGRA Take 60 mg by mouth daily.   Fluticasone-Salmeterol 250-50 MCG/DOSE  Aepb Commonly known as:  ADVAIR Inhale 1 puff into the lungs 2 (two) times daily.   furosemide 40 MG tablet Commonly known as:  LASIX Take 40 mg by mouth 3 (three) times a week.   guaiFENesin 100 MG/5ML Soln Commonly known as:  ROBITUSSIN Take 5 mLs by mouth every 6 (six) hours as needed for cough or to loosen phlegm.   HYDROcodone-acetaminophen 5-325 MG tablet Commonly known as:  NORCO/VICODIN Take 1 tablet by mouth every 6 (six) hours as needed for moderate pain or severe pain.   ipratropium 0.02 % nebulizer solution Commonly known as:  ATROVENT Inhale 2.5 mLs into the lungs 4 (four) times daily.   meclizine 25 MG tablet Commonly known as:  ANTIVERT Take 25 mg by mouth 3 (three) times daily as needed.   metoprolol tartrate 25 MG tablet Commonly known as:  LOPRESSOR Take 12.5 mg by mouth daily at 2 PM.   Muscle Rub 10-15 % Crea Apply 1 application topically as needed for muscle pain (Aspercream).   predniSONE 10 MG (21) Tbpk tablet Commonly known as:  STERAPRED UNI-PAK 21 TAB Take 6 tabs first day, 5 tab on day 2, then 4 on day 3rd, 3 tabs on day 4th , 2 tab on day 5th, and 1 tab on 6th day.   sertraline 25 MG tablet Commonly known as:  ZOLOFT Take 25 mg by mouth daily.   simvastatin 40 MG tablet Commonly known as:  ZOCOR Take 20 mg by mouth daily.   Spiriva HandiHaler 18 MCG inhalation capsule Generic drug:  tiotropium Place 1 capsule into inhaler and inhale daily.        DISCHARGE INSTRUCTIONS:    Follow with surgical clinic in 1 to 2 weeks.  If you experience worsening of your admission symptoms, develop shortness of breath, life threatening emergency, suicidal or homicidal thoughts you must seek medical attention immediately by calling 911 or calling your MD immediately  if symptoms less severe.  You Must read complete instructions/literature along with all the possible adverse reactions/side effects for all the Medicines you take and that have been  prescribed to you. Take any new Medicines after you have completely understood and accept all the possible adverse reactions/side effects.   Please note  You were cared for by a hospitalist during your hospital stay. If you have any questions about your discharge medications or the care you received while you were in the hospital after you are discharged, you can call the unit and asked to speak with the hospitalist on call if the hospitalist that took care of you is not available. Once you are discharged, your primary care physician will handle any further medical issues. Please note that NO REFILLS for any discharge medications will be authorized once you are discharged, as it is imperative that you return to your primary care physician (or establish a relationship with a primary care physician if you do not have one) for your aftercare needs so that they can reassess your need for medications and monitor your lab values.    Today   CHIEF COMPLAINT:   Chief Complaint  Patient presents with  . Abdominal Pain    HISTORY OF PRESENT ILLNESS:  Penny Johnson  is a 78 y.o. female with a known history of COPD on oxygen supplementation, history of coal dust exposure due to working at a [employer], nephrolithiasis, osteoporosis, hyperlipidemia, hypertension, and pneumonia presenting to  the ED with chief complaints of right upper quadrant pain with onset around midnight.  Patient reports she has been having intermittent pain for the past few days with associated symptoms of nausea. Denies fevers or chills, vomiting, diarrhea, shortness of breath, chest pain, or any other associated GI symptoms.  On arrival to the ED, she was afebrile with blood pressure 142/94 mm Hg and pulse rate 107 beats/min. There were no focal neurological deficits; she was alert and oriented x4, but was noted to be short of breath with increased work of breathing.  Her oxygen saturation was 88% on room air therefore placed on  oxygen via nasal cannula.  Initial pertinent labs revealed elevated WBC of 12.2, K+ 3.2, Na 134, glucose 165, creatinine 1.12, AST 431, ALT 131, alk phos 170, bilirubin 1.9 GFR 48; shewas evaluated in the context of the global COVID-19 pandemic, which necessitated consideration that the patient might be at risk for infection with the SARS-CoV-2 virus that causes COVID-19 and was found to be at low risk. ECG showed sinus rhythm 69 beats per minute.  CT abdomen pelvis was obtained and showed Positive for Acute Choledocholithiasis with multiple gallstones in the CBD. Dilated CBD up to 20 mm and intrahepatic ducts. Distal duct impacted stone estimated at 16 mm diameter with superimposed cholelithiasis.   VITAL SIGNS:  Blood pressure 127/64, pulse 90, temperature 97.9 F (36.6 C), temperature source Oral, resp. rate 20, height '5\' 3"'$  (1.6 m), weight 69.3 kg, SpO2 91 %.  I/O:    Intake/Output Summary (Last 24 hours) at 06/08/2018 1126 Last data filed at 06/08/2018 1023 Gross per 24 hour  Intake 120 ml  Output -  Net 120 ml    PHYSICAL EXAMINATION:  GENERAL:  78 y.o.-year-old patient lying in the bed with no acute distress.  EYES: Pupils equal, round, reactive to light and accommodation. No scleral icterus. Extraocular muscles intact.  HEENT: Head atraumatic, normocephalic. Oropharynx and nasopharynx clear.  NECK:  Supple, no jugular venous distention. No thyroid enlargement, no tenderness.  LUNGS: Normal breath sounds bilaterally, no wheezing, rales,rhonchi or crepitation. No use of accessory muscles of respiration.  CARDIOVASCULAR: S1, S2 normal. No murmurs, rubs, or gallops.  ABDOMEN: Soft, non-tender, non-distended. Bowel sounds present. No organomegaly or mass.  EXTREMITIES: No pedal edema, cyanosis, or clubbing.  NEUROLOGIC: Cranial nerves II through XII are intact. Muscle strength 5/5 in all extremities. Sensation intact. Gait not checked.  PSYCHIATRIC: The patient is alert and oriented x  3.  SKIN: No obvious rash, lesion, or ulcer.   DATA REVIEW:   CBC Recent Labs  Lab 06/08/18 0504  WBC 9.6  HGB 9.7*  HCT 30.8*  PLT 190    Chemistries  Recent Labs  Lab 06/08/18 0504  NA 136  K 3.5  CL 104  CO2 26  GLUCOSE 116*  BUN 18  CREATININE 1.04*  CALCIUM 8.4*  AST 53*  ALT 106*  ALKPHOS 84  BILITOT 0.6    Cardiac Enzymes No results for input(s): TROPONINI in the last 168 hours.  Microbiology Results  Results for orders placed or performed during the hospital encounter of 06/05/18  Surgical PCR screen     Status: Abnormal   Collection Time: 06/06/18  8:28 AM  Result Value Ref Range Status   MRSA, PCR POSITIVE (A) NEGATIVE Final    Comment: RESULT CALLED TO, READ BACK BY AND VERIFIED WITH: CALLED TO MALKA SINANI AT 0956 KLM    Staphylococcus aureus POSITIVE (A) NEGATIVE Final  Comment: (NOTE) The Xpert SA Assay (FDA approved for NASAL specimens in patients 55 years of age and older), is one component of a comprehensive surveillance program. It is not intended to diagnose infection nor to guide or monitor treatment. Performed at [facility], [address], [place], [place] [address]     RADIOLOGY:  No results found.  EKG:   Orders placed or performed in visit on 06/05/18  . EKG 12-Lead      Management plans discussed with the patient, family and they are in agreement.  CODE STATUS:     Code Status Orders  (From admission, onward)         Start     Ordered   06/05/18 0751  Full code  Continuous     06/05/18 0755        Code Status History    This patient has a current code status but no historical code status.    Advance Directive Documentation     Most Recent Value  Type of Advance Directive  Healthcare Power of Attorney  Pre-existing out of facility DNR order (yellow form or pink MOST form)  -  "MOST" Form in Place?  -      TOTAL TIME TAKING CARE OF THIS PATIENT: 35 minutes.    Vaughan Basta  M.D on 06/08/2018 at 11:26 AM  Between 7am to 6pm - Pager - [phone]  After 6pm go to [email or web address] - password [email or web address] [place] [facility]  Office  [phone]  CC: Primary care physician; Department, [facility]   Note: This dictation was prepared with Dragon dictation along with smaller phrase technology. Any transcriptional errors that result from this process are unintentional.

## 2018-06-11 LAB — SURGICAL PATHOLOGY

## 2019-08-10 DIAGNOSIS — N1831 Chronic kidney disease, stage 3a: Secondary | ICD-10-CM | POA: Insufficient documentation

## 2019-09-28 DIAGNOSIS — E0590 Thyrotoxicosis, unspecified without thyrotoxic crisis or storm: Secondary | ICD-10-CM | POA: Insufficient documentation

## 2020-02-27 IMAGING — CT CT ABDOMEN AND PELVIS WITH CONTRAST
2 of 5 series · 15 of 46 positions shown, 17 images · IV contrast (APPLIED)
Comparison: Chest radiographs 03/04/2018 and earlier.

CLINICAL DATA: 77-year-old female with abdominal pain since
midnight. Nausea. On home oxygen.

EXAM:
CT ABDOMEN AND PELVIS WITH CONTRAST
TECHNIQUE: Multidetector CT imaging of the abdomen and pelvis was performed
using the standard protocol following bolus administration of
intravenous contrast.
CONTRAST:  100mL OMNIPAQUE IOHEXOL 300 MG/ML  SOLN

[Series 2: routine abd/pel with · axial · 0.79mm/px · z∈[+891,+1271]mm · 12 of 86 slices shown, 14 images]
[im 5/86  soft-tissue]
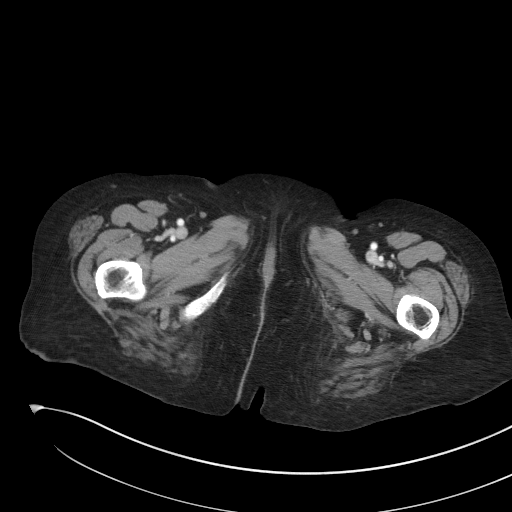
[im 5/86  bone]
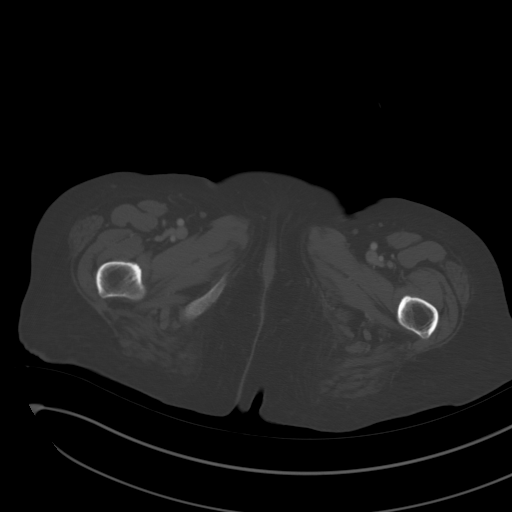
[im 15/86  soft-tissue]
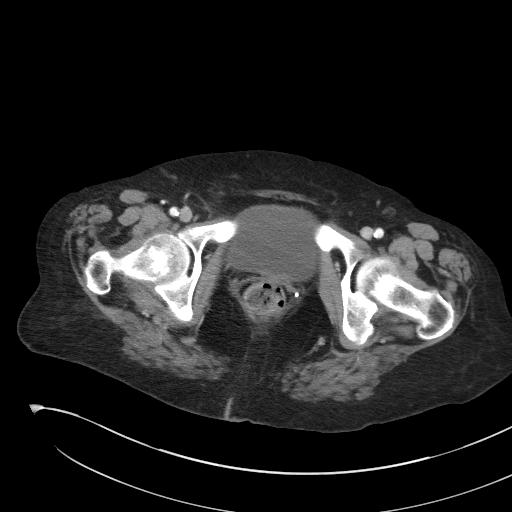
[im 19/86  soft-tissue]
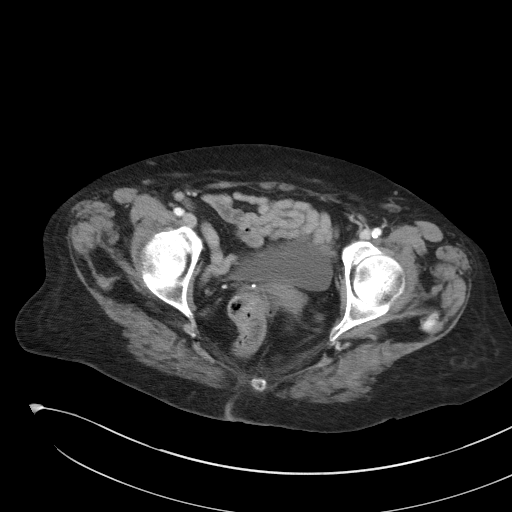
[im 24/86  soft-tissue]
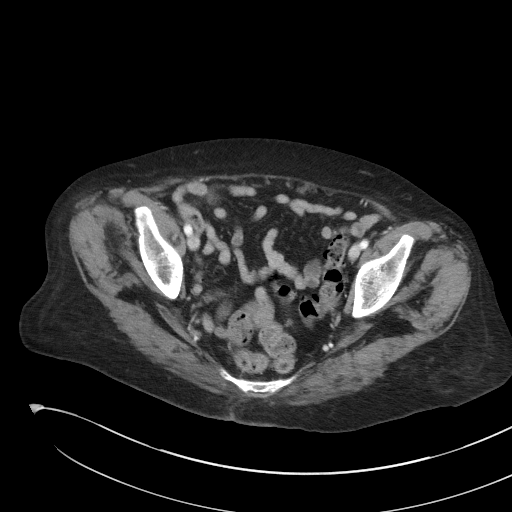
[im 34/86  soft-tissue]
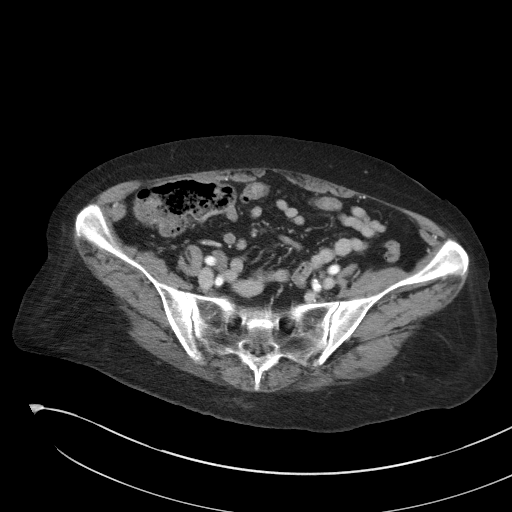
[im 38/86  soft-tissue]
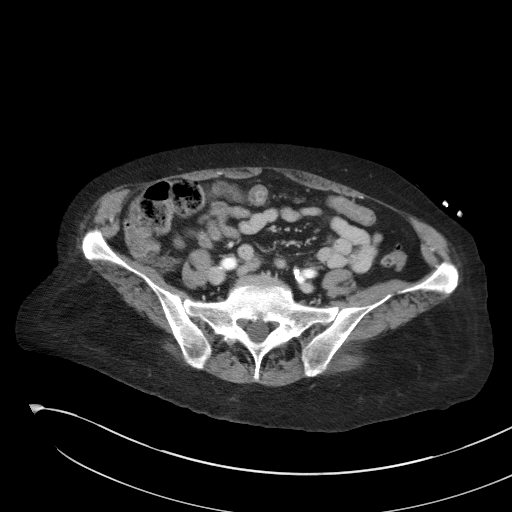
[im 48/86  soft-tissue]
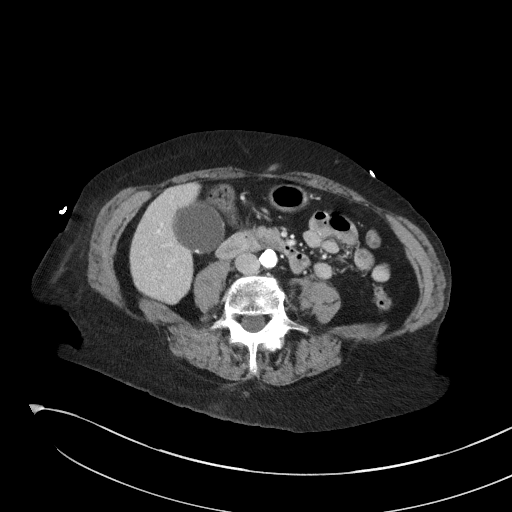
[im 52/86  soft-tissue]
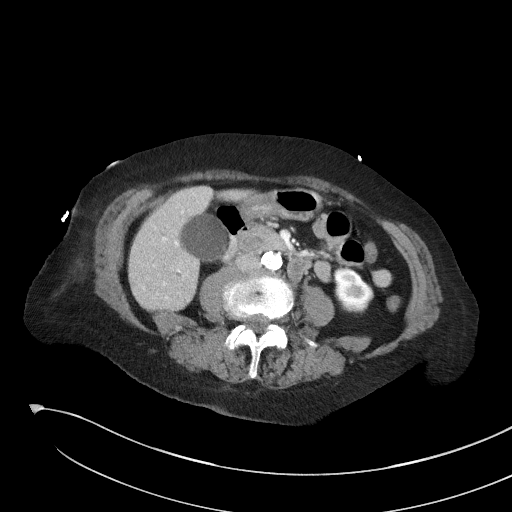
[im 62/86  soft-tissue]
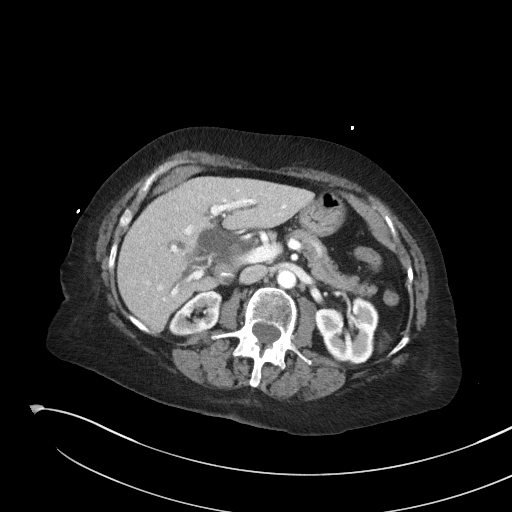
[im 62/86  bone]
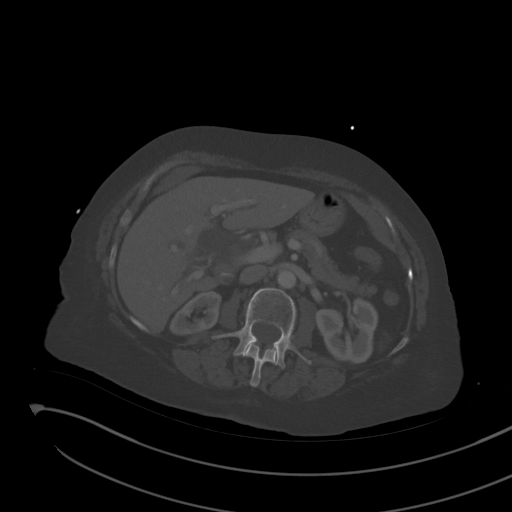
[im 67/86  soft-tissue]
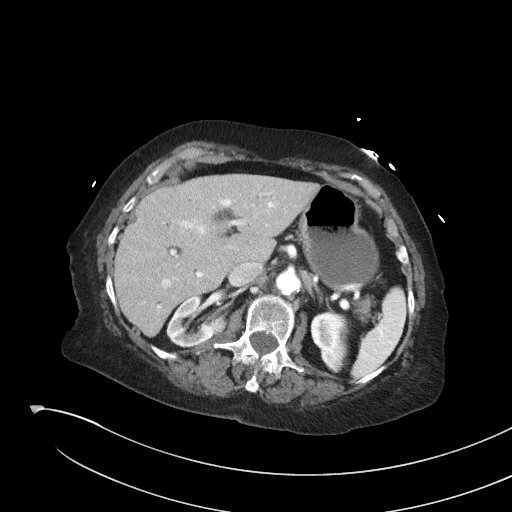
[im 71/86  soft-tissue]
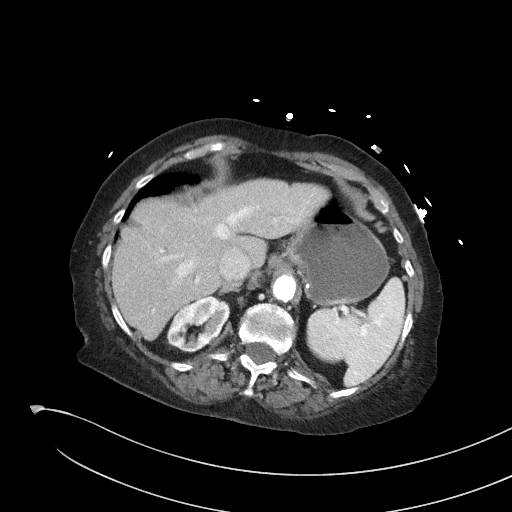
[im 81/86  soft-tissue]
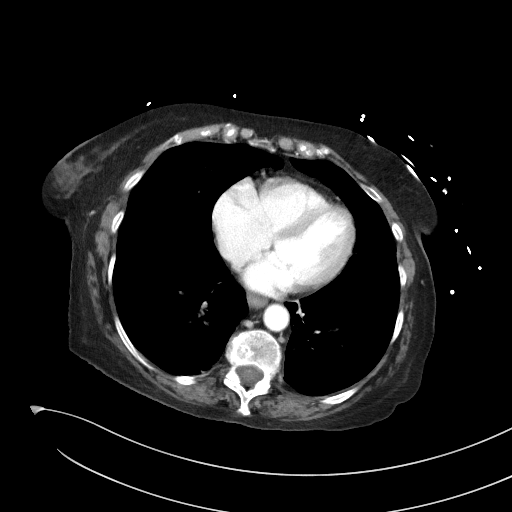

[Series 5: coronal st · coronal · 0.67mm/px · 3 of 77 slices shown]
[im 26/77  soft-tissue]
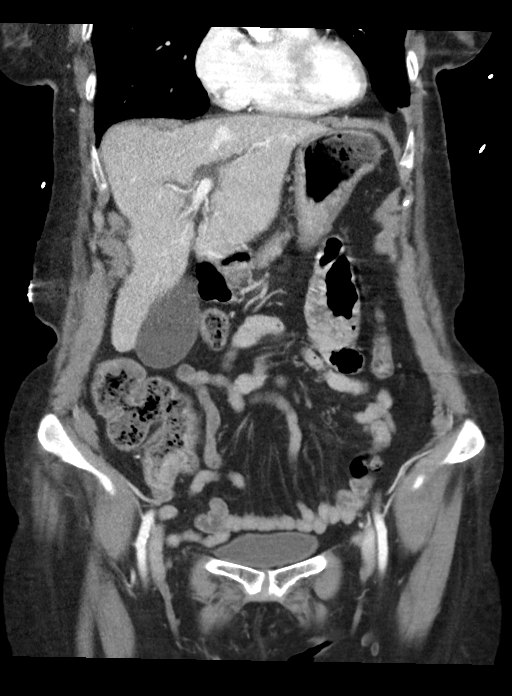
[im 34/77  soft-tissue]
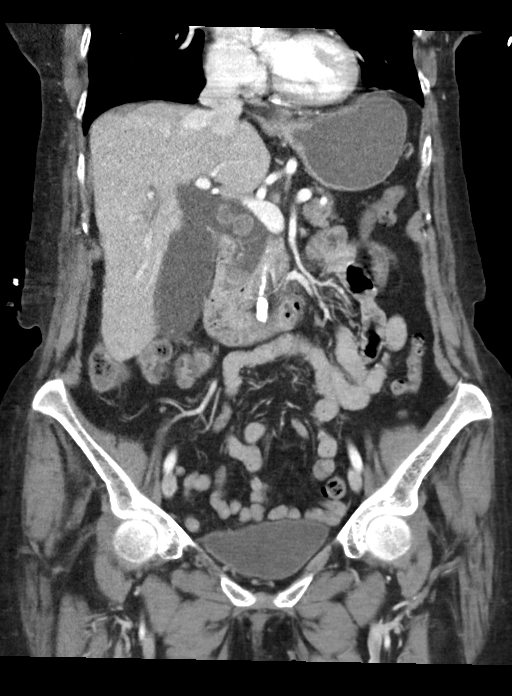
[im 43/77  soft-tissue]
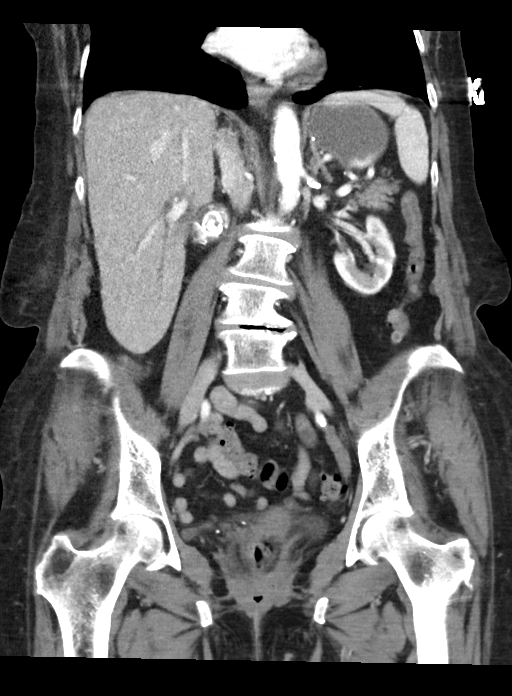

[15 of 46 positions shown; findings below may reference images not displayed]

FINDINGS: Lower chest: Centrilobular emphysema at the lung bases. No confluent
lower lung opacity, pleural or pericardial effusion. Calcified
granuloma in the left posterior costophrenic angle.

Hepatobiliary: Multiple gallstones measuring up to 20 millimeters
diameter. There are less calcified stones impacted within the common
bile duct seen on coronal images 34 through 36. The distal impacted
stone is estimated at 16 millimeters diameter. The duct is dilated
from 11-20 millimeters diameter.

Superimposed generalized intrahepatic biliary ductal dilatation. No
pericholecystic inflammatory stranding at this time. No discrete
liver lesion.

Pancreas: Main pancreatic duct remains within normal limits. No
pancreatic inflammation identified.

Spleen: Negative (small calcified granuloma).

Adrenals/Urinary Tract: Negative adrenal glands. Bilateral renal
enhancement and contrast excretion is symmetric and normal.
Occasional renal cortical scarring. Decompressed proximal ureters.
Unremarkable urinary bladder. Pelvic phleboliths.

Stomach/Bowel: No dilated large or small bowel. Diverticulosis of
the sigmoid colon and descending colon with no active inflammation.
Redundant large bowel. The transverse colon is decompressed.
Occasional transverse colon diverticula. Negative terminal ileum.
Normal appendix on series 2, image 61. Mildly fluid distended
stomach. Small gastric hiatal hernia.

No free air, free fluid.

Vascular/Lymphatic: Extensive Aortoiliac calcified atherosclerosis.
Major arterial structures are patent. Portal venous system is
patent.

Reproductive: Negative.

Other: No pelvic free fluid.

Musculoskeletal: Degenerative changes in the spine. No acute osseous
abnormality identified.
IMPRESSION: 1. Positive for Acute Choledocholithiasis with multiple gallstones
in the CBD.
Dilated CBD up to 20 mm and intrahepatic ducts. Distal duct impacted
stone estimated at 16 mm diameter. Superimposed cholelithiasis.
Gastroenterology consultation recommended.
2. No pancreatic inflammation. No other acute process identified in
the abdomen or pelvis.
3. Diverticulosis of the large bowel. Aortic Atherosclerosis
(2GS8V-ZHD.D) and Emphysema (2GS8V-DZ3.3).

## 2020-09-28 ENCOUNTER — Other Ambulatory Visit: Payer: Self-pay

## 2020-09-28 ENCOUNTER — Encounter: Payer: Medicare HMO | Admitting: Primary Care

## 2020-09-30 ENCOUNTER — Other Ambulatory Visit: Payer: Self-pay

## 2020-09-30 ENCOUNTER — Non-Acute Institutional Stay: Payer: Medicare HMO | Admitting: Primary Care

## 2020-09-30 DIAGNOSIS — K219 Gastro-esophageal reflux disease without esophagitis: Secondary | ICD-10-CM | POA: Insufficient documentation

## 2020-09-30 DIAGNOSIS — F419 Anxiety disorder, unspecified: Secondary | ICD-10-CM | POA: Insufficient documentation

## 2020-09-30 DIAGNOSIS — K5792 Diverticulitis of intestine, part unspecified, without perforation or abscess without bleeding: Secondary | ICD-10-CM | POA: Insufficient documentation

## 2020-09-30 DIAGNOSIS — I10 Essential (primary) hypertension: Secondary | ICD-10-CM | POA: Insufficient documentation

## 2020-09-30 DIAGNOSIS — J449 Chronic obstructive pulmonary disease, unspecified: Secondary | ICD-10-CM

## 2020-09-30 NOTE — Progress Notes (Signed)
[facility] Palliative Care Consult Note Telephone: [phone]  Fax: [phone]   Date of encounter: 09/30/20 11:51 AM PATIENT NAME: Penny Johnson [place] [address]   [phone] (home)  DOB: 1940/11/06 MRN: [id number] PRIMARY CARE PROVIDER:    Verl Blalock, NP [address]  [place] [address]  REFERRING PROVIDER:    Verl Blalock, NP [address] [place],  [place] [address] [phone]   RESPONSIBLE PARTY:    Contact Information     Name Relation Home Work [place]  [phone]        I met face to face with patient  in [facility]. Palliative Care was asked to follow this patient by consultation request of  Penny Blalock, NP  to address advance care planning and complex medical decision making. This is the initial visit.                                     ASSESSMENT AND PLAN / RECOMMENDATIONS:   Advance Care Planning/Goals of Care: Goals include to maximize quality of life and symptom management. Patient/health care surrogate gave his/her permission to discuss.Our advance care planning conversation included a discussion about:    Experiences with loved ones who have been seriously ill or have died  Exploration of personal, cultural or spiritual beliefs that might influence medical decisions  Exploration of goals of care in the event of a sudden injury or illness  Identification and preparation of a healthcare agent - daughter Butch Penny. Attempted to phone POA and no answer, message left CODE STATUS:staff endorses full code.  Symptom Management/Plan:  Dyspnea: endorses worsening SOB, using oxygen. She does nebs periodically as well. She states her breathing is worsening and limits her activity.  Nutrition: staff endorses she is not eating well, she endorses having lost from size 16 to size 12 but she looks smaller, maybe size 8-10. Baseline  wt in Epic 152 lbs 2 years ago, 139 lbs in 5/22 now 123 lbs. ( 19% weight loss  in 2 years, has 12% loss in past 3 months)  Albumin was WNL in 5/22 but needs repeating . This weight loss is a concerning sign. Recommend imaging if consistent with goals of care. Needs supplemental nutrition if she will take. She has boost and magic cups ordered.Staff states she does not eat many meals and notes previous do outline her limited intake over some months.  She may benefit from mirtazapine, begin with 7.5 mg po nightly. Continue weekly weights.   Follow up Palliative Care Visit: Palliative care will continue to follow for complex medical decision making, advance care planning, and clarification of goals. Return 2-4 weeks or prn.  I spent 45 minutes providing this consultation. More than 50% of the time in this consultation was spent in counseling and care coordination.  PPS: 40%  HOSPICE ELIGIBILITY/DIAGNOSIS: yes/ weight loss   Chief Complaint: weight loss  HISTORY OF PRESENT ILLNESS:  Penny Johnson is a 80 y.o. year old female  with h/o 50 pack years smoking, COPD IV, hyperthyroidism, dementia, depression/anxiety . CKC 3. She is not eating well and losing weight. Staff reports she does not eat much and she endorses not being hungry a lot of the meals. She denies pain /nausea/constipation. More dx may be indicated if consistent with goals of care.  No imaging found in Epic system but may be in another eMR.  History obtained from review of EMR, discussion with primary team, and interview with family, facility staff/caregiver and/or Ms. Penny Johnson.  I reviewed available labs, medications, imaging, studies and related documents from the EMR.  Records reviewed and summarized above.   ROS  General: NAD EYES: denies vision changes ENMT: denies dysphagia Cardiovascular: denies chest pain, endorses  DOE Pulmonary:endorse  cough, endorses increased SOB Abdomen: endorses poor appetite, denies constipation,  endorses continence of bowel GU: denies dysuria, endorses continence of urine MSK: endorses  weakness,  no falls reported Skin: denies rashes or wounds Neurological: denies pain, denies insomnia Psych: Endorses positive mood Heme/lymph/immuno: denies bruises, abnormal bleeding  Physical Exam: Current and past weights: 122 lbs, used to be size 16 and now is a size 8-10. 12 % loss in 3 mos. Constitutional: NAD General: frail appearing, thin EYES: anicteric sclera, lids intact, no discharge  ENMT: intact hearing, oral mucous membranes moist, dentition intact CV:  S1 S2 RRR, no LE edema Pulmonary: Moves air with difficulty,  +increased work of breathing, ++ cough, oxygen 2 l / np Abdomen: intake 100%, normo-active BS + 4 quadrants, soft and non tender, no ascites GU: deferred MSK: ++ sarcopenia, moves all extremities, ambulatory with walker Skin: warm and dry, no rashes or wounds on visible skin Neuro:  ++ generalized weakness,  ++ cognitive impairment Psych:mildly anxious affect, A and O x 2 Hem/lymph/immuno: no widespread bruising CURRENT PROBLEM LIST:  Patient Active Problem List   Diagnosis Date Noted   Anxiety 09/30/2020   GERD (gastroesophageal reflux disease) 09/30/2020   Diverticulitis 09/30/2020   Essential hypertension 09/30/2020   Hyperthyroidism 09/28/2019   Stage 3a chronic kidney disease ([place]) 08/10/2019   Choledocholithiasis 06/05/2018   Abnormal liver CT    COPD, severe (HCC) 08/10/2013   Depressive disorder 11/12/2012   Osteoporosis 11/12/2012   Hyperlipidemia 11/12/2012     PAST MEDICAL HISTORY:  Active Ambulatory Problems    Diagnosis Date Noted   Choledocholithiasis 06/05/2018   Abnormal liver CT    Anxiety 09/30/2020   COPD, severe ([place]) 08/10/2013   Depressive disorder 11/12/2012   GERD (gastroesophageal reflux disease) 09/30/2020   Osteoporosis 11/12/2012   Stage 3a chronic kidney disease ([place]) 08/10/2019   Diverticulitis 09/30/2020   Essential  hypertension 09/30/2020   Hyperlipidemia 11/12/2012   Hyperthyroidism 09/28/2019   Resolved Ambulatory Problems    Diagnosis Date Noted   No Resolved Ambulatory Problems   Past Medical History:  Diagnosis Date   COPD (chronic obstructive pulmonary disease) ([place])    Hypertension    Nephrolithiasis    Pneumonia      SOCIAL HX:  Social History   Tobacco Use   Smoking status: Former   Smokeless tobacco: Never  Substance Use Topics   Alcohol use: Not on file   FAMILY HX: No further family history attainable from patient or on chart review; no family present.    ALLERGIES:  Allergies  Allergen Reactions   Theophylline Palpitations     PERTINENT MEDICATIONS:  Outpatient Encounter Medications as of 09/30/2020  Medication Sig   acetaminophen (TYLENOL) 650 MG CR tablet Take 650 mg by mouth every 6 (six) hours as needed for pain.   albuterol (VENTOLIN HFA) 108 (90 Base) MCG/ACT inhaler Inhale 2 puffs into the lungs every 4 (four) hours as needed for wheezing.   citalopram (CELEXA) 40 MG tablet Take 40 mg by mouth 2 (two) times daily.   Fluticasone-Umeclidin-Vilant (  TRELEGY ELLIPTA) 100-62.5-25 MCG/INH AEPB Inhale 1 puff into the lungs daily.   furosemide (LASIX) 40 MG tablet Take 40 mg by mouth daily.   guaiFENesin (MUCINEX) 600 MG 12 hr tablet Take 600 mg by mouth 2 (two) times daily as needed for cough.   ipratropium-albuterol (DUONEB) 0.5-2.5 (3) MG/3ML SOLN Take 3 mLs by nebulization in the morning, at noon, in the evening, and at bedtime.   meclizine (ANTIVERT) 25 MG tablet Take 25 mg by mouth daily.   Menthol-Methyl Salicylate (MUSCLE RUB) 10-15 % CREA Apply 1 application topically 3 (three) times daily as needed for muscle pain (Aspercream). To back, shoulders and arms   methimazole (TAPAZOLE) 10 MG tablet Take 10 mg by mouth daily.   metoprolol succinate (TOPROL-XL) 50 MG 24 hr tablet Take 50 mg by mouth daily.   OXYGEN Inhale 2 L/min into the lungs continuous.    potassium chloride SA (KLOR-CON) 20 MEQ tablet Take 1 tablet by mouth daily.   simvastatin (ZOCOR) 40 MG tablet Take 20 mg by mouth daily.    SYMBICORT 160-4.5 MCG/ACT inhaler Inhale 2 puffs into the lungs in the morning and at bedtime. Rinse mouth after administration   Vitamin D, Ergocalciferol, (DRISDOL) 1.25 MG (50000 UNIT) CAPS capsule Take 1 capsule by mouth every 30 (thirty) days.   [DISCONTINUED] ACETAMINOPHEN PO Take 650 mg by mouth every 4 (four) hours as needed for pain.   [DISCONTINUED] guaiFENesin (ROBITUSSIN) 100 MG/5ML SOLN Take 5 mLs by mouth every 6 (six) hours as needed for cough or to loosen phlegm.   [DISCONTINUED] ipratropium (ATROVENT) 0.02 % nebulizer solution Inhale 2.5 mLs into the lungs 4 (four) times daily.   [DISCONTINUED] albuterol (PROVENTIL) (2.5 MG/3ML) 0.083% nebulizer solution Inhale 3 mLs into the lungs 4 (four) times daily.   [DISCONTINUED] alendronate (FOSAMAX) 70 MG tablet Take 70 mg by mouth once a week.   [DISCONTINUED] azelastine (ASTELIN) 0.1 % nasal spray Place 1-2 sprays into both nostrils 2 (two) times daily.   [DISCONTINUED] fexofenadine (ALLEGRA) 60 MG tablet Take 60 mg by mouth daily.   [DISCONTINUED] Fluticasone-Salmeterol (ADVAIR) 250-50 MCG/DOSE AEPB Inhale 1 puff into the lungs 2 (two) times daily.   [DISCONTINUED] guaiFENesin (MUCINEX) 600 MG 12 hr tablet Take 1 tablet by mouth every 12 (twelve) hours as needed. Prn cough   [DISCONTINUED] HYDROcodone-acetaminophen (NORCO/VICODIN) 5-325 MG tablet Take 1 tablet by mouth every 6 (six) hours as needed for moderate pain or severe pain.   [DISCONTINUED] metoprolol tartrate (LOPRESSOR) 25 MG tablet Take 12.5 mg by mouth daily at 2 PM.   [DISCONTINUED] predniSONE (STERAPRED UNI-PAK 21 TAB) 10 MG (21) TBPK tablet Take 6 tabs first day, 5 tab on day 2, then 4 on day 3rd, 3 tabs on day 4th , 2 tab on day 5th, and 1 tab on 6th day.   [DISCONTINUED] sertraline (ZOLOFT) 25 MG tablet Take 25 mg by mouth daily.    [DISCONTINUED] SPIRIVA HANDIHALER 18 MCG inhalation capsule Place 1 capsule into inhaler and inhale daily.   No facility-administered encounter medications on file as of 09/30/2020.     Thank you for the opportunity to participate in the care of Ms. Penny Johnson.  The palliative care team will continue to follow. Please call our office at [phone] if we can be of additional assistance.   Jason Coop, NP , DNP, [facility]  COVID-19 PATIENT SCREENING TOOL Asked and negative response unless otherwise noted:  Have you had symptoms of covid, tested positive or been in contact  with someone with symptoms/positive test in the past 5-10 days?

## 2020-10-07 ENCOUNTER — Telehealth: Payer: Self-pay | Admitting: Primary Care

## 2020-10-07 NOTE — Telephone Encounter (Signed)
Daughter called from  request from visit 1 week ago for f/u. We discussed role of palliative medicine. She denies dramatic weight loss. Discussed mirtazipine, which daughter was willing. Pulmonology feels dizziness may be from metoprolol. BP 92/53;  she may benefit from reduction in metolprolol.   Daughter mentioned pt was known to endocrine but thought last visit was 5/22. It was actually 8/21. At that time she was Rx tapazole Record shows hyperthyroid but daughter said she took meds for 1-2 months then stopped and did not follow back up. I have requested daughter to f/u with endocrine to re establish and we can help manage and maintain her course. Daughter states she will call Dr. Trisha Mangle and make f/u appt. Pt is now in ALF and will have medication management there.  I will f/u in 2-4 weeks.

## 2020-10-08 ENCOUNTER — Emergency Department: Payer: Medicare HMO

## 2020-10-08 ENCOUNTER — Other Ambulatory Visit: Payer: Self-pay

## 2020-10-08 ENCOUNTER — Emergency Department
Admission: EM | Admit: 2020-10-08 | Discharge: 2020-10-09 | Disposition: A | Discharge status: Home / Self Care | Payer: Medicare HMO | Attending: Emergency Medicine | Admitting: Emergency Medicine

## 2020-10-08 ENCOUNTER — Encounter: Payer: Self-pay | Admitting: Radiology

## 2020-10-08 DIAGNOSIS — J441 Chronic obstructive pulmonary disease with (acute) exacerbation: Secondary | ICD-10-CM | POA: Diagnosis not present

## 2020-10-08 DIAGNOSIS — Z7951 Long term (current) use of inhaled steroids: Secondary | ICD-10-CM | POA: Diagnosis not present

## 2020-10-08 DIAGNOSIS — Z87891 Personal history of nicotine dependence: Secondary | ICD-10-CM | POA: Insufficient documentation

## 2020-10-08 DIAGNOSIS — Z79899 Other long term (current) drug therapy: Secondary | ICD-10-CM | POA: Diagnosis not present

## 2020-10-08 DIAGNOSIS — Z20822 Contact with and (suspected) exposure to covid-19: Secondary | ICD-10-CM | POA: Diagnosis not present

## 2020-10-08 DIAGNOSIS — N183 Chronic kidney disease, stage 3 unspecified: Secondary | ICD-10-CM | POA: Insufficient documentation

## 2020-10-08 DIAGNOSIS — R0602 Shortness of breath: Secondary | ICD-10-CM | POA: Diagnosis present

## 2020-10-08 DIAGNOSIS — Z9981 Dependence on supplemental oxygen: Secondary | ICD-10-CM | POA: Diagnosis not present

## 2020-10-08 DIAGNOSIS — I129 Hypertensive chronic kidney disease with stage 1 through stage 4 chronic kidney disease, or unspecified chronic kidney disease: Secondary | ICD-10-CM | POA: Insufficient documentation

## 2020-10-08 LAB — CBC
HCT: 37.3 % (ref 36.0–46.0)
Hemoglobin: 13.3 g/dL (ref 12.0–15.0)
MCH: 30.6 pg (ref 26.0–34.0)
MCHC: 35.7 g/dL (ref 30.0–36.0)
MCV: 85.9 fL (ref 80.0–100.0)
Platelets: 267 10*3/uL (ref 150–400)
RBC: 4.34 MIL/uL (ref 3.87–5.11)
RDW: 14 % (ref 11.5–15.5)
WBC: 13.2 10*3/uL — ABNORMAL HIGH (ref 4.0–10.5)
nRBC: 0 % (ref 0.0–0.2)

## 2020-10-08 LAB — COMPREHENSIVE METABOLIC PANEL
ALT: 15 U/L (ref 0–44)
AST: 28 U/L (ref 15–41)
Albumin: 3.7 g/dL (ref 3.5–5.0)
Alkaline Phosphatase: 67 U/L (ref 38–126)
Anion gap: 18 — ABNORMAL HIGH (ref 5–15)
BUN: 17 mg/dL (ref 8–23)
CO2: 21 mmol/L — ABNORMAL LOW (ref 22–32)
Calcium: 9.1 mg/dL (ref 8.9–10.3)
Chloride: 96 mmol/L — ABNORMAL LOW (ref 98–111)
Creatinine, Ser: 1.54 mg/dL — ABNORMAL HIGH (ref 0.44–1.00)
GFR, Estimated: 34 mL/min — ABNORMAL LOW (ref >60)
Glucose, Bld: 102 mg/dL — ABNORMAL HIGH (ref 70–99)
Potassium: 3.8 mmol/L (ref 3.5–5.1)
Sodium: 135 mmol/L (ref 135–145)
Total Bilirubin: 1.5 mg/dL — ABNORMAL HIGH (ref 0.3–1.2)
Total Protein: 6.8 g/dL (ref 6.5–8.1)

## 2020-10-08 LAB — TROPONIN I (HIGH SENSITIVITY): Troponin I (High Sensitivity): 10 ng/L (ref <18)

## 2020-10-08 LAB — RESP PANEL BY RT-PCR (FLU A&B, COVID) ARPGX2
Influenza A by PCR: NEGATIVE
Influenza B by PCR: NEGATIVE
SARS Coronavirus 2 by RT PCR: NEGATIVE

## 2020-10-08 LAB — BRAIN NATRIURETIC PEPTIDE: B Natriuretic Peptide: 24.3 pg/mL (ref 0.0–100.0)

## 2020-10-08 MED ORDER — PREDNISONE 10 MG PO TABS: 10.0000 mg | ORAL_TABLET | Freq: Every day | ORAL | 0 refills | Status: DC

## 2020-10-08 MED ORDER — IPRATROPIUM-ALBUTEROL 0.5-2.5 (3) MG/3ML IN SOLN
3.0000 mL | Freq: Once | RESPIRATORY_TRACT | Status: AC
  Administered 2020-10-08: 3 mL via RESPIRATORY_TRACT
  Filled 2020-10-08: qty 6

## 2020-10-08 MED ORDER — METHYLPREDNISOLONE SODIUM SUCC 125 MG IJ SOLR
62.5000 mg | Freq: Once | INTRAMUSCULAR | Status: AC
  Administered 2020-10-08: 62.5 mg via INTRAVENOUS
  Filled 2020-10-08: qty 2

## 2020-10-08 MED ORDER — IPRATROPIUM-ALBUTEROL 0.5-2.5 (3) MG/3ML IN SOLN
3.0000 mL | Freq: Once | RESPIRATORY_TRACT | Status: AC
  Administered 2020-10-08: 3 mL via RESPIRATORY_TRACT

## 2020-10-08 NOTE — ED Provider Notes (Signed)
[facility] Emergency Department Provider Note  Time seen: 7:47 PM  I have reviewed the triage vital signs and the nursing notes.   HISTORY  Chief Complaint Shortness of Breath   HPI Penny Johnson is a 80 y.o. female with a past medical history of COPD, hypertension, hyperlipidemia, anxiety, presents to the emergency department for shortness of breath.  Patient wears 2 L of oxygen via nasal cannula 24/7.  States today she has been feeling more more short of breath.  EMS states upon arrival respiratory rate around 40 breaths/min.  They gave a DuoNeb and transported to the emergency department.  Upon arrival here patient states she is feeling much better with a respiratory rate around 25 breaths/min.  Denies any fever.  Denies any increased cough.  Denies any chest pain or increased lower extremity edema.   Past Medical History:  Diagnosis Date   COPD (chronic obstructive pulmonary disease) (HCC)    Hyperlipidemia    Hypertension    Nephrolithiasis    Pneumonia     Patient Active Problem List   Diagnosis Date Noted   Anxiety 09/30/2020   GERD (gastroesophageal reflux disease) 09/30/2020   Diverticulitis 09/30/2020   Essential hypertension 09/30/2020   Hyperthyroidism 09/28/2019   Stage 3a chronic kidney disease (HCC) 08/10/2019   Choledocholithiasis 06/05/2018   Abnormal liver CT    COPD, severe (HCC) 08/10/2013   Depressive disorder 11/12/2012   Osteoporosis 11/12/2012   Hyperlipidemia 11/12/2012    Past Surgical History:  Procedure Laterality Date   CHOLECYSTECTOMY N/A 06/06/2018   Procedure: LAPAROSCOPIC CHOLECYSTECTOMY;  Surgeon: Sung Amabile, DO;  Location: [facility] ORS;  Service: General;  Laterality: N/A;   ERCP N/A 06/05/2018   Procedure: ENDOSCOPIC RETROGRADE CHOLANGIOPANCREATOGRAPHY (ERCP);  Surgeon: Midge Minium, MD;  Location: [facility] ENDOSCOPY;  Service: Endoscopy;  Laterality: N/A;    Prior to Admission medications   Medication Sig Start  Date End Date Taking? Authorizing Provider  acetaminophen (TYLENOL) 650 MG CR tablet Take 650 mg by mouth every 6 (six) hours as needed for pain.    [provider]  albuterol (VENTOLIN HFA) 108 (90 Base) MCG/ACT inhaler Inhale 2 puffs into the lungs every 4 (four) hours as needed for wheezing. 04/24/17   [provider]  citalopram (CELEXA) 40 MG tablet Take 40 mg by mouth 2 (two) times daily. 03/17/18   [provider]  Fluticasone-Umeclidin-Vilant (TRELEGY ELLIPTA) 100-62.5-25 MCG/INH AEPB Inhale 1 puff into the lungs daily. 05/30/20   [provider]  furosemide (LASIX) 40 MG tablet Take 40 mg by mouth daily. 01/27/18   [provider]  guaiFENesin (MUCINEX) 600 MG 12 hr tablet Take 600 mg by mouth 2 (two) times daily as needed for cough.    [provider]  ipratropium-albuterol (DUONEB) 0.5-2.5 (3) MG/3ML SOLN Take 3 mLs by nebulization in the morning, at noon, in the evening, and at bedtime. 09/12/20   [provider]  meclizine (ANTIVERT) 25 MG tablet Take 25 mg by mouth daily. 01/01/18   [provider]  Menthol-Methyl Salicylate (MUSCLE RUB) 10-15 % CREA Apply 1 application topically 3 (three) times daily as needed for muscle pain (Aspercream). To back, shoulders and arms    [provider]  methimazole (TAPAZOLE) 10 MG tablet Take 10 mg by mouth daily. 08/23/20   [provider]  metoprolol succinate (TOPROL-XL) 50 MG 24 hr tablet Take 50 mg by mouth daily. 09/29/20   [provider]  OXYGEN Inhale 2 L/min into the lungs  continuous.    [provider]  potassium chloride SA (KLOR-CON) 20 MEQ tablet Take 1 tablet by mouth daily. 06/30/20 06/30/21  [provider]  simvastatin (ZOCOR) 40 MG tablet Take 20 mg by mouth daily.  05/16/18   [provider]  SYMBICORT 160-4.5 MCG/ACT inhaler Inhale 2 puffs into the lungs in the morning and at bedtime. Rinse mouth after administration  09/20/20   [provider]  Vitamin D, Ergocalciferol, (DRISDOL) 1.25 MG (50000 UNIT) CAPS capsule Take 1 capsule by mouth every 30 (thirty) days. 09/14/20   [provider]    Allergies  Allergen Reactions   Theophylline Palpitations    No family history on file.  Social History Social History   Tobacco Use   Smoking status: Former   Smokeless tobacco: Never    Review of Systems Constitutional: Negative for fever. Cardiovascular: Negative for chest pain. Respiratory: Positive for shortness of breath. Gastrointestinal: Negative for abdominal pain, vomiting  Musculoskeletal: Negative for musculoskeletal complaints Neurological: Negative for headache All other ROS negative  ____________________________________________   PHYSICAL EXAM:  VITAL SIGNS: ED Triage Vitals  Enc Vitals Group     BP      Pulse      Resp      Temp      Temp src      SpO2      Weight      Height      Head Circumference      Peak Flow      Pain Score      Pain Loc      Pain Edu?      Excl. in GC?    Constitutional: Patient is awake alert and oriented.  Slight tachypnea but states she is feeling much better. Eyes: Normal exam ENT      Head: Normocephalic and atraumatic.      Mouth/Throat: Mucous membranes are moist. Cardiovascular: Regular rhythm rate around 100 bpm.  No obvious murmur. Respiratory: Mild tachypnea but no significant respiratory effort noted.  Slight expiratory wheeze bilaterally. Gastrointestinal: Soft and nontender. No distention.   Musculoskeletal: Nontender with normal range of motion in all extremities. Neurologic:  Normal speech and language. No gross focal neurologic deficits  Skin:  Skin is warm, dry and intact.  Psychiatric: Mood and affect are normal.   ____________________________________________    EKG  EKG viewed and interpreted by myself shows atrial fibrillation at 118 bpm with a narrow QRS, normal axis, normal intervals, nonspecific ST  changes.  ____________________________________________    RADIOLOGY  Severe emphysema otherwise negative.  ____________________________________________   INITIAL IMPRESSION / ASSESSMENT AND PLAN / ED COURSE  Pertinent labs & imaging results that were available during my care of the patient were reviewed by me and considered in my medical decision making (see chart for details).   Patient presents emergency department for worsening shortness of breath with tachypnea and expiratory wheeze.  Lab work is pending, we will dose duo nebs, Solu-Medrol obtain a chest x-ray.  Slight expiratory wheeze on exam highly suspect COPD exacerbation.  Patient states she is feeling much better after her DuoNeb.  Denies any chest pain.  However we will check a troponin and BNP as a precaution.  Patient appears much improved.  States she is feeling much better after the treatments.  Daughter is here with the patient he states the patient appears to be acting and breathing like normal.  I offered admission to the hospital, daughter spoke to the patient  and after speaking with her decided that she would be better off being discharged home as she is no longer short of breath.  I believe this is reasonable plan of care.  I discussed return precautions.  They are both agreeable to this plan of care.  Patient will be discharged to her nursing facility.  Penny Johnson was evaluated in Emergency Department on 10/08/2020 for the symptoms described in the history of present illness. She was evaluated in the context of the global COVID-19 pandemic, which necessitated consideration that the patient might be at risk for infection with the SARS-CoV-2 virus that causes COVID-19. Institutional protocols and algorithms that pertain to the evaluation of patients at risk for COVID-19 are in a state of rapid change based on information released by regulatory bodies including the CDC and federal and state organizations. These policies  and algorithms were followed during the patient's care in the ED.  ____________________________________________   FINAL CLINICAL IMPRESSION(S) / ED DIAGNOSES  Dyspnea COPD exacerbation   Minna Antis, MD 10/08/20 2205

## 2020-10-08 NOTE — ED Notes (Signed)
Report called to [facility].

## 2020-10-08 NOTE — ED Notes (Signed)
Called [facility] for transport to [facility] [address] [place]   [address]

## 2020-10-08 NOTE — ED Notes (Addendum)
Daughter at bedside, updated to plan of care. Pt continues with duo neb. Coached through purse lipped breathing excerises via teach back method with positive effect. Given lemon glycerin swabs prior to duo neb.

## 2020-10-08 NOTE — ED Triage Notes (Signed)
Pt BIBA from [facility] for SOB/ Resp distress. Upon Ems arrival, pt noted to RR of 50 s/p 4 MDI albuterol puffs & albuterol SVN from facility. Given duoneb en route with no relief.   Found 86 on baseline 2L at facility.  Pt speaking in 2-3 word sentences. Coached through pursed lip breathing exercises upon arrival with + effect. Currently on RA with SPO2 96%

## 2020-10-08 NOTE — Discharge Instructions (Addendum)
Please take your steroids as prescribed for their entire course.  Return to the emergency department for any worsening trouble breathing, any chest pain, or any other symptom personally concerning to yourself.

## 2020-10-09 NOTE — ED Notes (Signed)
Report given to EMS. Pt transported back to facility.

## 2020-10-14 ENCOUNTER — Telehealth: Payer: Self-pay | Admitting: Primary Care

## 2020-10-14 NOTE — Telephone Encounter (Signed)
T/c to ALF personnel to clarify medications. Pt is on tapazole currently. Pt has been settling in better to ALF now, beginning to participate with staff and has improved mood. She has been at ALF 1 month and has had some grief at the move. Currently she is able to ambulate to meals and is reliable to make some of her own decisions. I spoke with  her daughter as well this week. Will continue to follow to assess for ongoing level of care needs and EOL s/sx.

## 2020-11-04 ENCOUNTER — Non-Acute Institutional Stay: Payer: Medicare HMO | Admitting: Primary Care

## 2020-11-04 ENCOUNTER — Other Ambulatory Visit: Payer: Self-pay

## 2020-11-04 DIAGNOSIS — J449 Chronic obstructive pulmonary disease, unspecified: Secondary | ICD-10-CM

## 2020-11-04 DIAGNOSIS — N1831 Chronic kidney disease, stage 3a: Secondary | ICD-10-CM

## 2020-11-04 DIAGNOSIS — Z515 Encounter for palliative care: Secondary | ICD-10-CM

## 2020-11-04 DIAGNOSIS — F419 Anxiety disorder, unspecified: Secondary | ICD-10-CM

## 2020-11-04 NOTE — Progress Notes (Signed)
Therapist, nutritional Palliative Care Consult Note Telephone: [phone]  Fax: [phone]    Date of encounter: 11/04/20 12:57 PM PATIENT NAME: Penny Johnson [address] [place] [address]   [phone] (home)  DOB: [birth year] MRN: [id number] PRIMARY CARE PROVIDER:    Ellan Lambert, NP,  [address] [place] [address] [phone]  REFERRING PROVIDER:   Ellan Lambert, NP [address] [place],  [place] [address] [phone]  RESPONSIBLE PARTY:    Contact Information     Name Relation Home Work Mobile   Penny Johnson  [phone]         I met face to face with patient in [facility]. Palliative Care was asked to follow this patient by consultation request of  Penny Lambert, NP to address advance care planning and complex medical decision making. This is a follow up visit.                                   ASSESSMENT AND PLAN / RECOMMENDATIONS:   Advance Care Planning/Goals of Care: Goals include to maximize quality of life and symptom management. Our advance care planning conversation included a discussion about:     Exploration of personal, cultural or spiritual beliefs that might influence medical decisions  CODE STATUS: FULL CODE  Symptom Management/Plan:  Dyspnea: Reports she is comfortable and able to converse without sob today. Has oxygen at 2 l.Went to ED for SOB last month, treated and released. ED reported respirations at 40, then reduced to 25 after nebulizer. We discussed today pursed lip breathing (PEEP) for hyperventilation due to anxiety. She endorse difficulty of controlling this if she feels anxious. I would recommend  anxiety medication for infrequent prn use.  Nutrition: Reports eating what she wants, staff endorses preference for sweets. She is eating 25-50% of meals. 123 lbs today, used to be size 16 and now is a size 8-10. 12 % loss in 4 mos but stable from  last weight assessment. Continue to offer supplements 1-2 x / day.  Follow up Palliative Care Visit: Palliative care will continue to follow for complex medical decision making, advance care planning, and clarification of goals. Return 6-8 weeks or prn.  I spent 40 minutes providing this consultation. More than 50% of the time in this consultation was spent in counseling and care coordination.   PPS: 40%  HOSPICE ELIGIBILITY/DIAGNOSIS: TBD  Chief Complaint: dyspnea  HISTORY OF PRESENT ILLNESS:  Penny Johnson is a 80 y.o. year old female  with COPD, weight loss. Had ED trip for SOB but d/c in 4 hours. Rx with nebs. Has been at baseline since.   History obtained from review of EMR, discussion with primary team, and interview with family, facility staff/caregiver and/or Ms. Penny Johnson.  I reviewed available labs, medications, imaging, studies and related documents from the EMR.  Records reviewed and summarized above.   ROS  General: NAD ENMT: denies dysphagia Cardiovascular: denies chest pain, denies DOE Pulmonary: denies cough,  occ  increased SOB Abdomen: endorses fair  appetite, denies constipation, endorses continence of bowel GU: denies dysuria, endorses continence of urine MSK:  endorses weakness,  no falls reported Skin: denies rashes or wounds Neurological: denies pain, denies insomnia Psych: Endorses positive mood Heme/lymph/immuno: denies bruises, abnormal bleeding  Physical Exam: Current and past weights: 123 lbs Constitutional: NAD General: frail appearing,  thin  EYES: anicteric sclera, lids intact, no discharge  ENMT: intact hearing, oral mucous membranes moist, dentition intact CV: no LE edema Pulmonary: no increased work of breathing, no cough, room air Abdomen: intake 50%,  no ascites GU: deferred MSK: + sarcopenia, moves all extremities, ambulatory with walker  Skin: warm and dry, no rashes or wounds on visible skin Neuro:  + generalized weakness,  no  cognitive impairment Psych: non-anxious affect, A and O x 3 Hem/lymph/immuno: no widespread bruising  Outpatient Encounter Medications as of 11/04/2020  Medication Sig   acetaminophen (TYLENOL) 650 MG CR tablet Take 650 mg by mouth every 6 (six) hours as needed for pain.   albuterol (VENTOLIN HFA) 108 (90 Base) MCG/ACT inhaler Inhale 2 puffs into the lungs every 4 (four) hours as needed for wheezing.   citalopram (CELEXA) 40 MG tablet Take 40 mg by mouth 2 (two) times daily.   Fluticasone-Umeclidin-Vilant (TRELEGY ELLIPTA) 100-62.5-25 MCG/INH AEPB Inhale 1 puff into the lungs daily. (Patient not taking: Reported on 10/08/2020)   furosemide (LASIX) 40 MG tablet Take 40 mg by mouth daily.   guaiFENesin (MUCINEX) 600 MG 12 hr tablet Take 600 mg by mouth 2 (two) times daily as needed for cough.   ipratropium-albuterol (DUONEB) 0.5-2.5 (3) MG/3ML SOLN Take 3 mLs by nebulization in the morning, at noon, in the evening, and at bedtime.   levofloxacin (LEVAQUIN) 750 MG tablet Take 750 mg by mouth daily.   meclizine (ANTIVERT) 25 MG tablet Take 25 mg by mouth daily.   Menthol-Methyl Salicylate (MUSCLE RUB) 10-15 % CREA Apply 1 application topically 3 (three) times daily as needed for muscle pain (Aspercream). To back, shoulders and arms   methimazole (TAPAZOLE) 10 MG tablet Take 10 mg by mouth daily.   metoprolol succinate (TOPROL-XL) 50 MG 24 hr tablet Take 50 mg by mouth daily.   OXYGEN Inhale 2 L/min into the lungs continuous.   potassium chloride SA (KLOR-CON) 20 MEQ tablet Take 1 tablet by mouth daily.   predniSONE (DELTASONE) 10 MG tablet Take 1 tablet (10 mg total) by mouth daily. Day 1-3: take 4 tablets PO daily Day 4-6: take 3 tablets PO daily Day 7-9: take 2 tablets PO daily Day 10-12: take 1 tablet PO daily   simvastatin (ZOCOR) 40 MG tablet Take 20 mg by mouth daily.    SYMBICORT 160-4.5 MCG/ACT inhaler Inhale 2 puffs into the lungs in the morning and at bedtime. Rinse mouth after  administration   Vitamin D, Ergocalciferol, (DRISDOL) 1.25 MG (50000 UNIT) CAPS capsule Take 1 capsule by mouth every 30 (thirty) days.   No facility-administered encounter medications on file as of 11/04/2020.    Thank you for the opportunity to participate in the care of Ms. Wynetta Emery.  The palliative care team will continue to follow. Please call our office at [phone] if we can be of additional assistance.   Jason Coop, NP   COVID-19 PATIENT SCREENING TOOL Asked and negative response unless otherwise noted:   Have you had symptoms of covid, tested positive or been in contact with someone with symptoms/positive test in the past 5-10 days?

## 2020-12-30 ENCOUNTER — Encounter: Payer: Medicare HMO | Admitting: Primary Care

## 2022-06-13 DEATH — deceased

## 2022-07-02 IMAGING — DX DG CHEST 1V PORT
1 series · 1 of 1 positions shown · non-contrast
Comparison: November 01, 2011

CLINICAL DATA: SOB

EXAM:
PORTABLE CHEST 1 VIEW

[chest ap]
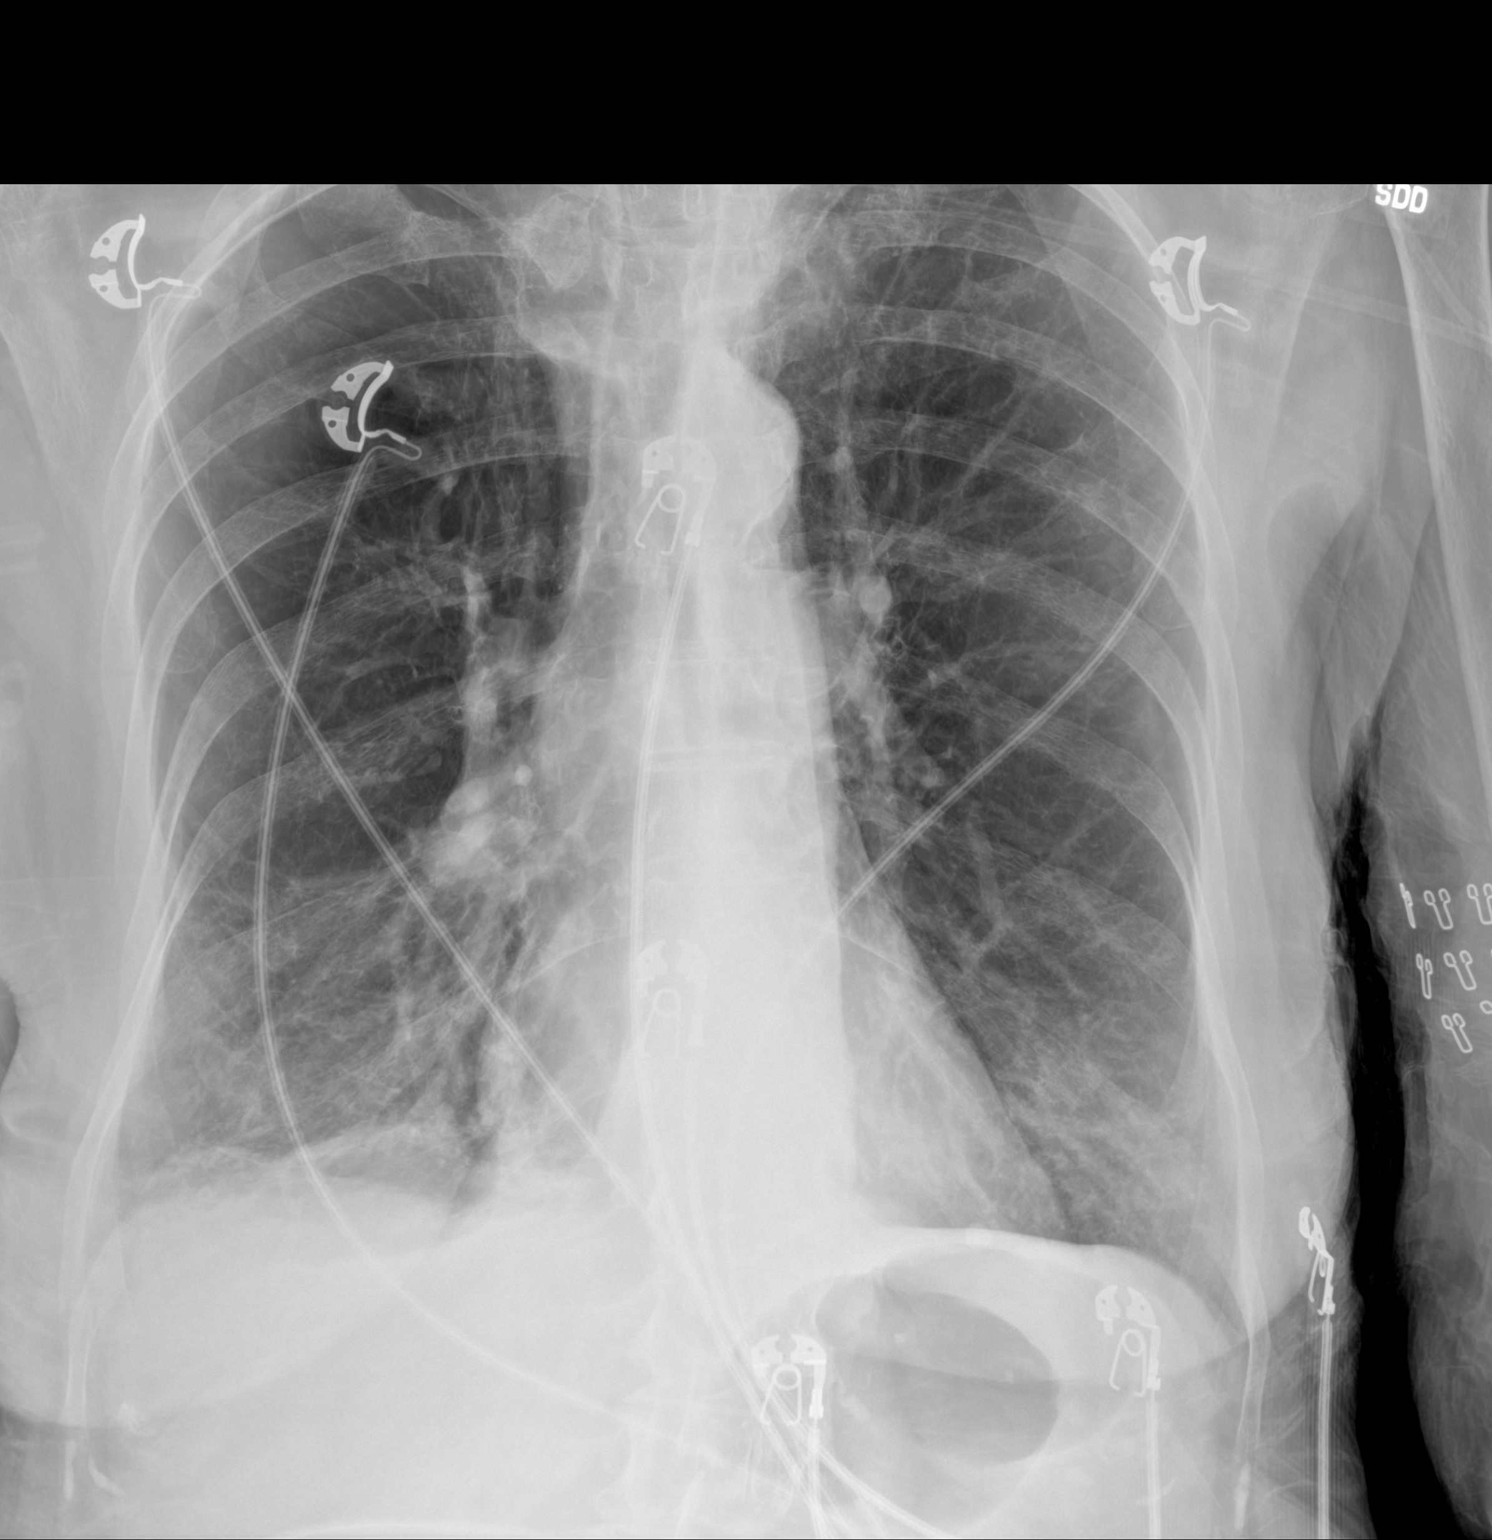

[1 of 1 positions shown; findings below may reference images not displayed]

FINDINGS: The cardiomediastinal silhouette is unchanged in contour.Severe
emphysematous changes. No pleural effusion. No pneumothorax.
Scattered bibasilar atelectasis. Visualized abdomen is unremarkable.
Multilevel degenerative changes of the thoracic spine.
IMPRESSION: Severe emphysematous changes with scattered atelectasis.

Recommend evaluation for candidacy for lung cancer screening CT.
